# Patient Record
Sex: Male | Born: 1960 | Race: White | Hispanic: No | Marital: Married | Smoking: Current every day smoker
Health system: Southern US, Community
[De-identification: ages and names within clinical notes are randomized; demographics above are authoritative.]

## PROBLEM LIST (undated history)

## (undated) DIAGNOSIS — I10 Essential (primary) hypertension: Secondary | ICD-10-CM

## (undated) DIAGNOSIS — N200 Calculus of kidney: Secondary | ICD-10-CM

## (undated) DIAGNOSIS — C801 Malignant (primary) neoplasm, unspecified: Secondary | ICD-10-CM

## (undated) DIAGNOSIS — R972 Elevated prostate specific antigen [PSA]: Secondary | ICD-10-CM

## (undated) HISTORY — PX: PROSTATECTOMY: SHX69

## (undated) HISTORY — PX: OTHER SURGICAL HISTORY: SHX169

## (undated) HISTORY — PX: NASAL SEPTUM SURGERY: SHX37

## (undated) HISTORY — DX: Calculus of kidney: N20.0

## (undated) HISTORY — PX: TONSILECTOMY, ADENOIDECTOMY, BILATERAL MYRINGOTOMY AND TUBES: SHX2538

## (undated) HISTORY — DX: Elevated prostate specific antigen (PSA): R97.20

## (undated) HISTORY — DX: Essential (primary) hypertension: I10

## (undated) HISTORY — PX: ANKLE SURGERY: SHX546

---

## 2007-02-19 ENCOUNTER — Ambulatory Visit: Payer: Self-pay | Admitting: Emergency Medicine

## 2013-07-21 ENCOUNTER — Ambulatory Visit: Payer: Self-pay | Admitting: Emergency Medicine

## 2014-07-19 DIAGNOSIS — E7800 Pure hypercholesterolemia, unspecified: Secondary | ICD-10-CM | POA: Insufficient documentation

## 2014-08-12 DIAGNOSIS — F17200 Nicotine dependence, unspecified, uncomplicated: Secondary | ICD-10-CM | POA: Insufficient documentation

## 2014-09-13 ENCOUNTER — Ambulatory Visit (INDEPENDENT_AMBULATORY_CARE_PROVIDER_SITE_OTHER): Payer: BLUE CROSS/BLUE SHIELD | Admitting: Urology

## 2014-09-13 VITALS — BP 142/91 | HR 89 | Ht 70.0 in | Wt 257.8 lb

## 2014-09-13 DIAGNOSIS — R972 Elevated prostate specific antigen [PSA]: Secondary | ICD-10-CM

## 2014-09-13 DIAGNOSIS — R310 Gross hematuria: Secondary | ICD-10-CM

## 2014-09-13 LAB — MICROSCOPIC EXAMINATION
Bacteria, UA: NONE SEEN
Epithelial Cells (non renal): NONE SEEN /hpf (ref 0–10)
RENAL EPITHEL UA: NONE SEEN /HPF

## 2014-09-13 LAB — URINALYSIS, COMPLETE
BILIRUBIN UA: NEGATIVE
Glucose, UA: NEGATIVE
Ketones, UA: NEGATIVE
Leukocytes, UA: NEGATIVE
Nitrite, UA: NEGATIVE
Protein, UA: NEGATIVE
Specific Gravity, UA: >1.03 — ABNORMAL HIGH (ref 1.005–1.030)
UUROB: 0.2 mg/dL (ref 0.2–1.0)
pH, UA: 6 (ref 5.0–7.5)

## 2014-09-13 LAB — BLADDER SCAN AMB NON-IMAGING: Scan Result: 48

## 2014-09-13 NOTE — Progress Notes (Addendum)
H&P  Chief Complaint: Kidney stones, gross hematuria, elevated PSA  History of Present Illness:  54 year old white male seen today for gross hematuria and elevated PSA.  1-gross hematuria-last month patient developed left flank pain and said he passed flexible material, gross hematuria with a small clot and then possibly a small stone. He did not collected. 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 He did have a KUB performed but results are not available. He's had no recurrent flank pain or gross hematuria. Typically his voids with a good stream and has no frequency or urgency.  PVR 48 mL  UA with 3-6 rbc / hpf today  2-Elevated PSA-patient PSA was 4.22 check June 2016. He is not sure if his had any prior PSA screening. He has no history of BPH or prostate biopsy. His father had prostate cancer and pancreatic cancer.   Past Medical History  Diagnosis Date  . Kidney stone    Past Surgical History  Procedure Laterality Date  . Spincter surgery    . Nasal septum surgery    . Tonsilectomy, adenoidectomy, bilateral myringotomy and tubes    . Ankle surgery      bone fragments removal    Home Medications:   (Not in a hospital admission) Allergies:  Allergies  Allergen Reactions  . Tylox [Oxycodone-Acetaminophen]     Cold chills and profuse sweating    Family History  Problem Relation Age of Onset  . Prostate cancer Father   . Cancer Father     pancreatic  . Urolithiasis Father   . Cancer Paternal Grandmother   . Cancer Maternal Grandmother   . Kidney cancer Neg Hx   . Urolithiasis Brother    Social History:  reports that he has been smoking.  He does not have any smokeless tobacco history on file. He reports that he drinks alcohol. He reports that he does not use illicit drugs.  ROS: A complete review of systems was performed.  All systems are negative except for pertinent findings as noted. ROS   +constipation +sinus problems   Physical Exam:  Vital signs in last 24 hours: @VSRANGES @ General:  Alert and oriented, No acute distress HEENT: Normocephalic, atraumatic Neck: No JVD or lymphadenopathy Cardiovascular: Regular rate and rhythm Lungs: Regular rate and effort Abdomen: Soft, nontender, nondistended, no abdominal masses Back: No CVA tenderness Extremities: No edema Neurologic: Grossly intact GU: Penis circumcised and without mass or lesion. No inguinal hernias palpated. Testicles descended bilaterally and palpably normal. On digital rectal exam the prostate was small, smooth and without hard area or nodule.  Laboratory Data:  No results found for this or any previous visit (from the past 24 hour(s)). No results found for this or any previous visit (from the past 240 hour(s)). Creatinine: No results for input(s): CREATININE in the last 168 hours.  Impression/Assessment:  -Gross hematuria-unclear etiology. Possible passed stone. Pt continues to have microhematuria. Recommend at least a noncontrast CT to evaluate kidneys ureter and bladder, stage for any other stones. Depending on CT results we also discussed the nature risk benefits of cystoscopy. I recommended cystoscopy giving a smoking history. I also discussed the importance of quitting smoking and risk of renal and bladder ca.   -Elevated PSA-we discussed the nature risks and benefits of PSA screening as well as the nature of elevated PSA-benign versus malignant conditions. We will resend a PSA with a reflex to free and  if it remains elevated were discussed prostate biopsy at next visit. Discussed importance of follow-up.  Plan:  -Noncontrast CT the abdomen and pelvis -Repeat PSA -Follow-up for cystoscopy and consider prostate biopsy  Patrick Hinton 09/13/2014, 11:23 AM

## 2014-09-18 ENCOUNTER — Telehealth: Payer: Self-pay

## 2014-09-18 NOTE — Telephone Encounter (Signed)
LMOM

## 2014-09-18 NOTE — Telephone Encounter (Signed)
-----   Message from Festus Aloe, MD sent at 09/18/2014  8:17 AM EDT ----- Patient needs a PSA drawn prior to his follow-up/cystoscopy

## 2014-09-24 ENCOUNTER — Ambulatory Visit
Admission: RE | Admit: 2014-09-24 | Discharge: 2014-09-24 | Disposition: A | Discharge status: Home / Self Care | Payer: BLUE CROSS/BLUE SHIELD | Source: Ambulatory Visit | Attending: Urology | Admitting: Urology

## 2014-09-24 DIAGNOSIS — K760 Fatty (change of) liver, not elsewhere classified: Secondary | ICD-10-CM | POA: Diagnosis not present

## 2014-09-24 DIAGNOSIS — N200 Calculus of kidney: Secondary | ICD-10-CM | POA: Insufficient documentation

## 2014-09-24 DIAGNOSIS — R310 Gross hematuria: Secondary | ICD-10-CM | POA: Diagnosis present

## 2014-10-11 ENCOUNTER — Encounter: Payer: Self-pay | Admitting: Urology

## 2014-10-11 ENCOUNTER — Ambulatory Visit (INDEPENDENT_AMBULATORY_CARE_PROVIDER_SITE_OTHER): Payer: BLUE CROSS/BLUE SHIELD | Admitting: Urology

## 2014-10-11 VITALS — BP 122/79 | HR 79 | Ht 70.0 in | Wt 260.6 lb

## 2014-10-11 DIAGNOSIS — R972 Elevated prostate specific antigen [PSA]: Secondary | ICD-10-CM

## 2014-10-11 DIAGNOSIS — R310 Gross hematuria: Secondary | ICD-10-CM | POA: Diagnosis not present

## 2014-10-11 LAB — URINALYSIS, COMPLETE
BILIRUBIN UA: NEGATIVE
GLUCOSE, UA: NEGATIVE
Ketones, UA: NEGATIVE
Leukocytes, UA: NEGATIVE
Nitrite, UA: NEGATIVE
PROTEIN UA: NEGATIVE
Specific Gravity, UA: 1.025 (ref 1.005–1.030)
UUROB: 0.2 mg/dL (ref 0.2–1.0)
pH, UA: 5.5 (ref 5.0–7.5)

## 2014-10-11 LAB — MICROSCOPIC EXAMINATION
Bacteria, UA: NONE SEEN
Epithelial Cells (non renal): NONE SEEN /hpf (ref 0–10)
RBC, UA: NONE SEEN /hpf (ref 0–?)

## 2014-10-11 MED ORDER — LIDOCAINE HCL 2 % EX GEL
1.0000 | Freq: Once | CUTANEOUS | Status: AC
Start: 2014-10-11 — End: 2014-10-11
  Administered 2014-10-11: 1 via URETHRAL

## 2014-10-11 MED ORDER — CIPROFLOXACIN HCL 500 MG PO TABS
500.0000 mg | ORAL_TABLET | Freq: Once | ORAL | Status: AC
  Administered 2014-10-11: 500 mg via ORAL

## 2014-10-11 NOTE — Progress Notes (Signed)
10/11/2014 12:26 PM   Patrick Hinton [birth year] [id number]  Referring provider: Sherrin Daisy, MD [place], [place] [address]  Chief Complaint  Patient presents with  . Procedure    cystoscopy, gross hematuria    HPI:  1 - Gross Hematuria - single episode gross blood 2016 with passage of small likely stone fragment. Prior 40PY smoker. CT stone study with punctate renal stone. Cysto unremarkable.  2 - Nephrolithiasis - punctate left intrarenal stone by CT 2016. One prior passage episode.  3 - Elevated PSA - PSA 4.2 by PCP 07/2014. He does have positive family history.  Today "Patrick Hinton" is seen for cysto and f/u above. No interval hematuria.   PMH: Past Medical History  Diagnosis Date  . Kidney stone   . Elevated PSA     Surgical History: Past Surgical History  Procedure Laterality Date  . Spincter surgery    . Nasal septum surgery    . Tonsilectomy, adenoidectomy, bilateral myringotomy and tubes    . Ankle surgery      bone fragments removal    Home Medications:    Medication List       This list is accurate as of: 10/11/14 12:26 PM.  Always use your most recent med list.               acetaminophen 325 MG tablet  Commonly known as:  TYLENOL  Take 650 mg by mouth every 6 (six) hours as needed.     ibuprofen 200 MG tablet  Commonly known as:  ADVIL,MOTRIN  Take 200 mg by mouth every 6 (six) hours as needed.     loratadine 10 MG tablet  Commonly known as:  CLARITIN  Take by mouth.        Allergies:  Allergies  Allergen Reactions  . Tylox [Oxycodone-Acetaminophen]     Cold chills and profuse sweating    Family History: Family History  Problem Relation Age of Onset  . Prostate cancer Father   . Cancer Father     pancreatic  . Urolithiasis Father   . Cancer Paternal Grandmother   . Cancer Maternal Grandmother   . Kidney cancer Neg Hx   . Urolithiasis Brother     Social History:  reports that he has been smoking Cigarettes.   He has been smoking about 1.00 pack per day. He does not have any smokeless tobacco history on file. He reports that he drinks alcohol. He reports that he does not use illicit drugs.  ROS:                                        Physical Exam: BP 122/79 mmHg  Pulse 79  Ht 5\' 10"  (1.778 m)  Wt 260 lb 9.6 oz (118.207 kg)  BMI 37.39 kg/m2  Constitutional:  Alert and oriented, No acute distress. HEENT: [place] AT, moist mucus membranes.  Trachea midline, no masses. Cardiovascular: No clubbing, cyanosis, or edema. Respiratory: Normal respiratory effort, no increased work of breathing. GI: Abdomen is soft, nontender, nondistended, no abdominal masses GU: No CVA tenderness.  Skin: No rashes, bruises or suspicious lesions. Lymph: No cervical or inguinal adenopathy. Neurologic: Grossly intact, no focal deficits, moving all 4 extremities. Psychiatric: Normal mood and affect.  Laboratory Data: No results found for: WBC, HGB, HCT, MCV, PLT  No results found for: CREATININE  No results found for: PSA  No results found for: TESTOSTERONE  No results found for: HGBA1C  Urinalysis    Component Value Date/Time   GLUCOSEU Negative 09/13/2014 1038   BILIRUBINUR Negative 09/13/2014 1038   NITRITE Negative 09/13/2014 1038   LEUKOCYTESUR Negative 09/13/2014 1038    Pertinent Imaging:  CYSTOSOPY: 64F flexible scope used after betadine prep and lidocaine jelly per penis to inspect entire ureterha. No stricture / lesions. No significant prostatic hypertrophy. Bladder with very mild trabeculation, no mases / erythema / foreign bodies. UO's anatomic. No additional findings with retroflexion. Scope withdrawn w/o complication.  Assessment & Plan:    1 - Gross Hematuria - likely from recent stone passage. He did NOT have ureteral conrast studies and we discussed utility of repeating Ct with contrast v. Operative retrogrades and he declines. WE both agree that this would be  necessary if this was to recur.   2 - Nephrolithiasis - current stone volume minimal, no specific therapy warranted.   3 - Elevated PSA - repeat PSA today. (was drawn prior to cysto)  4 - RTC 3-4 weeks to review PSA, then yearly if normalized v. Discuss biopsy if truly still elevated   No Follow-up on file.  Alexis Frock, [facility] [address], [place], [place] [address] [phone]

## 2014-10-12 LAB — PSA, TOTAL AND FREE
PSA, Free Pct: 11.7 %
PSA, Free: 0.41 ng/mL
Prostate Specific Ag, Serum: 3.5 ng/mL (ref 0.0–4.0)

## 2014-11-12 ENCOUNTER — Encounter: Payer: Self-pay | Admitting: Urology

## 2014-11-12 ENCOUNTER — Ambulatory Visit (INDEPENDENT_AMBULATORY_CARE_PROVIDER_SITE_OTHER): Payer: BLUE CROSS/BLUE SHIELD | Admitting: Urology

## 2014-11-12 VITALS — BP 156/91 | HR 92 | Ht 70.0 in | Wt 259.9 lb

## 2014-11-12 DIAGNOSIS — R310 Gross hematuria: Secondary | ICD-10-CM

## 2014-11-12 DIAGNOSIS — N200 Calculus of kidney: Secondary | ICD-10-CM | POA: Diagnosis not present

## 2014-11-12 DIAGNOSIS — R972 Elevated prostate specific antigen [PSA]: Secondary | ICD-10-CM | POA: Diagnosis not present

## 2014-11-12 LAB — URINALYSIS, COMPLETE
Bilirubin, UA: NEGATIVE
GLUCOSE, UA: NEGATIVE
Ketones, UA: NEGATIVE
Leukocytes, UA: NEGATIVE
Nitrite, UA: NEGATIVE
PH UA: 7 (ref 5.0–7.5)
PROTEIN UA: NEGATIVE
Specific Gravity, UA: 1.02 (ref 1.005–1.030)
Urobilinogen, Ur: 0.2 mg/dL (ref 0.2–1.0)

## 2014-11-12 LAB — MICROSCOPIC EXAMINATION: BACTERIA UA: NONE SEEN

## 2014-11-12 NOTE — Progress Notes (Signed)
11:28 AM   Patrick Hinton 04/11/1960 [id number]  Referring provider: Sherrin Daisy, MD [place], [place] [address]  Chief Complaint  Patient presents with  . Elevated PSA    discuss PSA results    HPI:  1 - Gross Hematuria - single episode gross blood 2016 with passage of small likely stone fragment. Prior 40PY smoker. CT stone study with punctate renal stone. Cysto unremarkable. He refused repeat contrast CT.   2 - Nephrolithiasis - punctate left intrarenal stone by CT 2016. One prior passage episode.  3 - Elevated PSA - He does have positive family history. 07/2014. - PSA 4.2 by PCP labs / DRE 40gm smooth ===> Recheck 10/2014 3.5  Today "Patrick Hinton" is seen for f/u above after recheck PSA. No interval hematuria.   PMH: Past Medical History  Diagnosis Date  . Kidney stone   . Elevated PSA     Surgical History: Past Surgical History  Procedure Laterality Date  . Spincter surgery    . Nasal septum surgery    . Tonsilectomy, adenoidectomy, bilateral myringotomy and tubes    . Ankle surgery      bone fragments removal    Home Medications:    Medication List       This list is accurate as of: 11/12/14 11:28 AM.  Always use your most recent med list.               acetaminophen 325 MG tablet  Commonly known as:  TYLENOL  Take 650 mg by mouth every 6 (six) hours as needed.     ibuprofen 200 MG tablet  Commonly known as:  ADVIL,MOTRIN  Take 200 mg by mouth every 6 (six) hours as needed.     loratadine 10 MG tablet  Commonly known as:  CLARITIN  Take by mouth.        Allergies:  Allergies  Allergen Reactions  . Tylox [Oxycodone-Acetaminophen]     Cold chills and profuse sweating    Family History: Family History  Problem Relation Age of Onset  . Prostate cancer Father   . Cancer Father     pancreatic  . Urolithiasis Father   . Cancer Paternal Grandmother   . Cancer Maternal Grandmother   . Kidney cancer Neg Hx   . Urolithiasis  Brother     Social History:  reports that he has been smoking Cigarettes.  He has been smoking about 1.00 pack per day. He does not have any smokeless tobacco history on file. He reports that he drinks alcohol. He reports that he does not use illicit drugs.  ROS: UROLOGY Frequent Urination?: No Hard to postpone urination?: No Burning/pain with urination?: No Get up at night to urinate?: No Leakage of urine?: No Urine stream starts and stops?: No Trouble starting stream?: No Do you have to strain to urinate?: No Blood in urine?: No Urinary tract infection?: No Sexually transmitted disease?: No Injury to kidneys or bladder?: No Painful intercourse?: No Weak stream?: No Erection problems?: No Penile pain?: No  Gastrointestinal Nausea?: No Vomiting?: No Indigestion/heartburn?: No Diarrhea?: No Constipation?: No  Constitutional Fever: No Night sweats?: No Weight loss?: No Fatigue?: No  Skin Skin rash/lesions?: No Itching?: No  Eyes Blurred vision?: No Double vision?: No  Ears/Nose/Throat Sore throat?: No Sinus problems?: Yes  Hematologic/Lymphatic Swollen glands?: No Easy bruising?: No  Cardiovascular Leg swelling?: No Chest pain?: No  Respiratory Cough?: Yes Shortness of breath?: No  Endocrine Excessive thirst?: No  Musculoskeletal Back pain?: No Joint pain?: No  Neurological Headaches?: Yes Dizziness?: No  Psychologic Depression?: No Anxiety?: No  Physical Exam: BP 156/91 mmHg  Pulse 92  Ht 5\' 10"  (1.778 m)  Wt 259 lb 14.4 oz (117.89 kg)  BMI 37.29 kg/m2  Constitutional:  Alert and oriented, No acute distress. HEENT: [place] AT, moist mucus membranes.  Trachea midline, no masses. Cardiovascular: No clubbing, cyanosis, or edema. Respiratory: Normal respiratory effort, no increased work of breathing. GI: Abdomen is soft, nontender, nondistended, no abdominal masses GU: No CVA tenderness. DRE 40gm smooth.  Skin: No rashes, bruises or  suspicious lesions. Lymph: No cervical or inguinal adenopathy. Neurologic: Grossly intact, no focal deficits, moving all 4 extremities. Psychiatric: Normal mood and affect.  Laboratory Data: No results found for: WBC, HGB, HCT, MCV, PLT  No results found for: CREATININE  Lab Results  Component Value Date   PSA 3.5 10/11/2014    No results found for: TESTOSTERONE  No results found for: HGBA1C  Urinalysis    Component Value Date/Time   GLUCOSEU Negative 10/11/2014 1136   BILIRUBINUR Negative 10/11/2014 1136   NITRITE Negative 10/11/2014 1136   LEUKOCYTESUR Negative 10/11/2014 1136     Assessment & Plan:    1 - Gross Hematuria - likely from recent stone passage. He did NOT have ureteral conrast studies and we discussed utility of repeating Ct with contrast v. Operative retrogrades and he declines. WE both agree that this would be necessary if this was to recur.   2 - Nephrolithiasis - current stone volume minimal, no specific therapy warranted.   3 - Elevated PSA - resolved. Reinforced need for continued annual screening and strongly consider further eval / BX for persistnat upward trends / worriseom DRE.   4 - RTC 1 yeat with PSA prior.    Return in about 1 year (around 11/12/2015).  Alexis Frock, [facility] [address] [place], [place] [address] [phone]

## 2015-02-19 DIAGNOSIS — R030 Elevated blood-pressure reading, without diagnosis of hypertension: Secondary | ICD-10-CM | POA: Insufficient documentation

## 2015-11-12 ENCOUNTER — Encounter: Payer: Self-pay | Admitting: Urology

## 2015-11-12 ENCOUNTER — Ambulatory Visit (INDEPENDENT_AMBULATORY_CARE_PROVIDER_SITE_OTHER): Payer: BLUE CROSS/BLUE SHIELD | Admitting: Urology

## 2015-11-12 VITALS — BP 146/85 | HR 77 | Ht 70.0 in | Wt 256.1 lb

## 2015-11-12 DIAGNOSIS — R972 Elevated prostate specific antigen [PSA]: Secondary | ICD-10-CM

## 2015-11-12 DIAGNOSIS — R3129 Other microscopic hematuria: Secondary | ICD-10-CM | POA: Diagnosis not present

## 2015-11-12 LAB — URINALYSIS, COMPLETE
BILIRUBIN UA: NEGATIVE
Glucose, UA: NEGATIVE
KETONES UA: NEGATIVE
Leukocytes, UA: NEGATIVE
NITRITE UA: NEGATIVE
PH UA: 7 (ref 5.0–7.5)
Protein, UA: NEGATIVE
RBC UA: NEGATIVE
SPEC GRAV UA: 1.02 (ref 1.005–1.030)
UUROB: 0.2 mg/dL (ref 0.2–1.0)

## 2015-11-12 LAB — MICROSCOPIC EXAMINATION: BACTERIA UA: NONE SEEN

## 2015-11-12 NOTE — Progress Notes (Signed)
11/12/2015 11:46 AM   Patrick Hinton 1960-04-21 [id number]  Referring provider: Sherrin Daisy, MD [place], [place] [id number]  Chief Complaint  Patient presents with  . Follow-up    gross hematuria, elevated PSA    HPI:  1 - Gross Hematuria - single episode gross blood 2016 with passage of small likely stone fragment. Prior 40PY smoker. CT stone study with punctate renal stone. Cysto unremarkable. He refused repeat contrast CT.   2 - Nephrolithiasis - punctate left intrarenal stone by CT 2016. One prior passage episode.  3 - Elevated PSA - He does have positive family history. 07/2014. - PSA 4.2 by PCP labs / DRE 40gm smooth ===> Recheck 10/2014 3.5  Today, patient seen for the above. He has had no gross hematuria. PSA was drawn. He thinks he maybe passed a stone three months ago with some flank pain and penile pain. This quickly resolved. He has 3-10 rbc's on UA today.    PMH: Past Medical History:  Diagnosis Date  . Elevated PSA   . Kidney stone     Surgical History: Past Surgical History:  Procedure Laterality Date  . ANKLE SURGERY     bone fragments removal  . NASAL SEPTUM SURGERY    . spincter surgery    . TONSILECTOMY, ADENOIDECTOMY, BILATERAL MYRINGOTOMY AND TUBES      Home Medications:    Medication List       Accurate as of 11/12/15 11:46 AM. Always use your most recent med list.          acetaminophen 325 MG tablet Commonly known as:  TYLENOL Take 650 mg by mouth every 6 (six) hours as needed.   cyclobenzaprine 10 MG tablet Commonly known as:  FLEXERIL Take by mouth.   ibuprofen 200 MG tablet Commonly known as:  ADVIL,MOTRIN Take 200 mg by mouth every 6 (six) hours as needed.   loratadine 10 MG tablet Commonly known as:  CLARITIN Take by mouth.       Allergies:  Allergies  Allergen Reactions  . Tylox [Oxycodone-Acetaminophen]     Cold chills and profuse sweating    Family History: Family History  Problem  Relation Age of Onset  . Prostate cancer Father   . Cancer Father     pancreatic  . Urolithiasis Father   . Cancer Paternal Grandmother   . Cancer Maternal Grandmother   . Kidney cancer Neg Hx   . Urolithiasis Brother     Social History:  reports that he has been smoking Cigarettes.  He has been smoking about 1.00 pack per day. He does not have any smokeless tobacco history on file. He reports that he drinks alcohol. He reports that he does not use drugs.  ROS: UROLOGY Frequent Urination?: No Hard to postpone urination?: No Burning/pain with urination?: No Get up at night to urinate?: No Leakage of urine?: No Urine stream starts and stops?: No Trouble starting stream?: No Do you have to strain to urinate?: No Blood in urine?: No Urinary tract infection?: No Sexually transmitted disease?: No Injury to kidneys or bladder?: No Painful intercourse?: No Weak stream?: No Erection problems?: No Penile pain?: No  Gastrointestinal Nausea?: No Vomiting?: No Indigestion/heartburn?: No Diarrhea?: No Constipation?: No  Constitutional Fever: No Night sweats?: No Weight loss?: No Fatigue?: No  Skin Skin rash/lesions?: No Itching?: No  Eyes Blurred vision?: No Double vision?: No  Ears/Nose/Throat Sore throat?: No Sinus problems?: Yes  Hematologic/Lymphatic Swollen glands?: No Easy bruising?: No  Cardiovascular Leg swelling?: No Chest pain?: No  Respiratory Cough?: No Shortness of breath?: No  Endocrine Excessive thirst?: No  Musculoskeletal Back pain?: No Joint pain?: No  Neurological Headaches?: No Dizziness?: No  Psychologic Depression?: No Anxiety?: No  Physical Exam: BP (!) 146/85   Pulse 77   Ht 5\' 10"  (1.778 m)   Wt 116.2 kg (256 lb 1.6 oz)   BMI 36.75 kg/m   Constitutional:  Alert and oriented, No acute distress. HEENT: [place] AT, moist mucus membranes.  Trachea midline, no masses. Cardiovascular: No clubbing, cyanosis, or  edema. Respiratory: Normal respiratory effort, no increased work of breathing. Skin: No rashes, bruises or suspicious lesions. Lymph: No cervical or inguinal adenopathy. Neurologic: Grossly intact, no focal deficits, moving all 4 extremities. Psychiatric: Normal mood and affect.  Laboratory Data: No results found for: WBC, HGB, HCT, MCV, PLT  No results found for: CREATININE  No results found for: PSA  No results found for: TESTOSTERONE  No results found for: HGBA1C  Urinalysis    Component Value Date/Time   APPEARANCEUR Clear 11/12/2014 1113   GLUCOSEU Negative 11/12/2014 1113   BILIRUBINUR Negative 11/12/2014 1113   PROTEINUR Negative 11/12/2014 1113   NITRITE Negative 11/12/2014 1113   LEUKOCYTESUR Negative 11/12/2014 1113    Imaging: CT images reviewed   Assessment & Plan:    1. Elevated PSA PSA was sent. Nl DRE today. Small prostate.   2. Gross hematuria resolved - Urinalysis, Complete  3. MH - check kidneys with renal u/s. F/u 1 year if stable.    No Follow-up on file.  Festus Aloe, [facility] [address] [place], [place] [address] [phone]

## 2015-11-13 LAB — PSA: Prostate Specific Ag, Serum: 3.4 ng/mL (ref 0.0–4.0)

## 2016-01-13 ENCOUNTER — Ambulatory Visit
Admission: RE | Admit: 2016-01-13 | Discharge: 2016-01-13 | Disposition: A | Discharge status: Home / Self Care | Payer: BLUE CROSS/BLUE SHIELD | Source: Ambulatory Visit | Attending: Urology | Admitting: Urology

## 2016-01-13 DIAGNOSIS — R3129 Other microscopic hematuria: Secondary | ICD-10-CM | POA: Diagnosis present

## 2016-01-21 ENCOUNTER — Telehealth: Payer: Self-pay

## 2016-01-21 NOTE — Telephone Encounter (Signed)
Festus Aloe, MD  Lestine Box, LPN        Notify patient his renal u/s was normal -- f/u next year as planned    No answer.

## 2016-01-22 NOTE — Telephone Encounter (Signed)
No vm set up. 

## 2016-01-23 NOTE — Telephone Encounter (Signed)
Spoke with pt in reference to RUS results. Pt voiced understanding.  

## 2016-08-02 ENCOUNTER — Ambulatory Visit: Admission: RE | Admit: 2016-08-02 | Source: Ambulatory Visit | Admitting: Gastroenterology

## 2016-08-02 ENCOUNTER — Encounter: Admission: RE | Payer: Self-pay | Source: Ambulatory Visit

## 2016-08-02 SURGERY — COLONOSCOPY WITH PROPOFOL
Anesthesia: General

## 2016-11-09 ENCOUNTER — Ambulatory Visit: Payer: BLUE CROSS/BLUE SHIELD

## 2017-03-10 ENCOUNTER — Encounter: Payer: Self-pay | Admitting: Urology

## 2017-03-10 ENCOUNTER — Ambulatory Visit (INDEPENDENT_AMBULATORY_CARE_PROVIDER_SITE_OTHER): Admitting: Urology

## 2017-03-10 VITALS — BP 137/89 | HR 96 | Ht 70.0 in | Wt 256.6 lb

## 2017-03-10 DIAGNOSIS — R310 Gross hematuria: Secondary | ICD-10-CM | POA: Diagnosis not present

## 2017-03-10 DIAGNOSIS — N200 Calculus of kidney: Secondary | ICD-10-CM

## 2017-03-10 DIAGNOSIS — Z125 Encounter for screening for malignant neoplasm of prostate: Secondary | ICD-10-CM

## 2017-03-10 NOTE — Progress Notes (Signed)
03/10/2017 11:04 AM   Patrick Hinton August 25, 1960 [id number]  Referring provider: Sherrin Daisy, MD No address on file  Chief Complaint  Patient presents with  . Follow-up    HPI: The patient is a 57 year old gentleman who presents today for annual follow-up.  1 - History of Gross Hematuria- single episode gross blood 2016 with passage of small likely stone fragment. Prior 40PY smoker. CT stone study with punctate renal stone. Cysto unremarkable. He refused repeat contrast CT. No further episodes.  2 - Nephrolithiasis- punctate left intrarenal stone by CT 2016. One prior passage episode.  3 - History of Elevated PSA- He does have positive family history. 07/2014. - PSA 4.2 by PCP labs / DRE 40gm smooth ===> Recheck 10/2014 3.5. 3.4 in September 2017.  He reports no issues over the last year.  He voids well with a good stream.  He is not bothered by any urinary symptoms.  No further episodes of gross hematuria which occurred a few years ago.  Due for PSA today.  PMH: Past Medical History:  Diagnosis Date  . Elevated PSA   . Kidney stone     Surgical History: Past Surgical History:  Procedure Laterality Date  . ANKLE SURGERY     bone fragments removal  . NASAL SEPTUM SURGERY    . spincter surgery    . TONSILECTOMY, ADENOIDECTOMY, BILATERAL MYRINGOTOMY AND TUBES      Home Medications:  Allergies as of 03/10/2017      Reactions   Tylox [oxycodone-acetaminophen]    Cold chills and profuse sweating      Medication List        Accurate as of 03/10/17 11:04 AM. Always use your most recent med list.          acetaminophen 325 MG tablet Commonly known as:  TYLENOL Take 650 mg by mouth every 6 (six) hours as needed.   ibuprofen 200 MG tablet Commonly known as:  ADVIL,MOTRIN Take 200 mg by mouth every 6 (six) hours as needed.   loratadine 10 MG tablet Commonly known as:  CLARITIN Take by mouth.       Allergies:  Allergies  Allergen Reactions  .  Tylox [Oxycodone-Acetaminophen]     Cold chills and profuse sweating    Family History: Family History  Problem Relation Age of Onset  . Prostate cancer Father   . Cancer Father        pancreatic  . Urolithiasis Father   . Cancer Paternal Grandmother   . Cancer Maternal Grandmother   . Kidney cancer Neg Hx   . Urolithiasis Brother     Social History:  reports that he has been smoking cigarettes.  He has been smoking about 1.00 pack per day. he has never used smokeless tobacco. He reports that he drinks alcohol. He reports that he does not use drugs.  ROS: UROLOGY Frequent Urination?: No Hard to postpone urination?: No Burning/pain with urination?: No Get up at night to urinate?: No Leakage of urine?: No Urine stream starts and stops?: No Trouble starting stream?: No Do you have to strain to urinate?: No Blood in urine?: No Urinary tract infection?: No Sexually transmitted disease?: No Injury to kidneys or bladder?: No Painful intercourse?: No Weak stream?: No Erection problems?: No Penile pain?: No  Gastrointestinal Nausea?: No Vomiting?: No Indigestion/heartburn?: No Diarrhea?: No Constipation?: No  Constitutional Fever: No Night sweats?: No Weight loss?: No Fatigue?: No  Skin Skin rash/lesions?: No Itching?: No  Eyes Blurred vision?: No  Double vision?: No  Ears/Nose/Throat Sore throat?: No Sinus problems?: No  Hematologic/Lymphatic Swollen glands?: No Easy bruising?: No  Cardiovascular Leg swelling?: No Chest pain?: No  Respiratory Cough?: No Shortness of breath?: No  Endocrine Excessive thirst?: No  Musculoskeletal Back pain?: No Joint pain?: Yes  Neurological Headaches?: No Dizziness?: No  Psychologic Depression?: No Anxiety?: No  Physical Exam: BP 137/89 (BP Location: Left Arm, Patient Position: Sitting, Cuff Size: Large)   Pulse 96   Ht 5\' 10"  (1.778 m)   Wt 256 lb 9.6 oz (116.4 kg)   BMI 36.82 kg/m     Constitutional:  Alert and oriented, No acute distress. HEENT: [place] AT, moist mucus membranes.  Trachea midline, no masses. Cardiovascular: No clubbing, cyanosis, or edema. Respiratory: Normal respiratory effort, no increased work of breathing. GI: Abdomen is soft, nontender, nondistended, no abdominal masses GU: No CVA tenderness.  DRE 2+ smooth benign. Skin: No rashes, bruises or suspicious lesions. Lymph: No cervical or inguinal adenopathy. Neurologic: Grossly intact, no focal deficits, moving all 4 extremities. Psychiatric: Normal mood and affect.  Laboratory Data: No results found for: WBC, HGB, HCT, MCV, PLT  No results found for: CREATININE  No results found for: PSA  No results found for: TESTOSTERONE  No results found for: HGBA1C  Urinalysis    Component Value Date/Time   APPEARANCEUR Cloudy (A) 11/12/2015 1114   GLUCOSEU Negative 11/12/2015 1114   BILIRUBINUR Negative 11/12/2015 1114   PROTEINUR Negative 11/12/2015 1114   NITRITE Negative 11/12/2015 1114   LEUKOCYTESUR Negative 11/12/2015 1114    Assessment & Plan:    1.  History of elevated PSA We will check PSA today.  He will follow-up in 1 year to continue prostate cancer screening.  Return in about 1 year (around 03/10/2018).  Nickie Retort, MD  [facility] [address] [place],  [address] [phone]

## 2017-03-11 LAB — PSA: Prostate Specific Ag, Serum: 6.3 ng/mL — ABNORMAL HIGH (ref 0.0–4.0)

## 2017-04-28 ENCOUNTER — Other Ambulatory Visit

## 2017-04-28 DIAGNOSIS — R972 Elevated prostate specific antigen [PSA]: Secondary | ICD-10-CM

## 2017-04-29 LAB — PSA: Prostate Specific Ag, Serum: 4.3 ng/mL — ABNORMAL HIGH (ref 0.0–4.0)

## 2017-07-04 DIAGNOSIS — Z87891 Personal history of nicotine dependence: Secondary | ICD-10-CM | POA: Insufficient documentation

## 2018-03-09 ENCOUNTER — Encounter: Payer: Self-pay | Admitting: Urology

## 2018-03-09 ENCOUNTER — Ambulatory Visit (INDEPENDENT_AMBULATORY_CARE_PROVIDER_SITE_OTHER): Admitting: Urology

## 2018-03-09 VITALS — BP 155/100 | HR 86 | Ht 70.0 in | Wt 271.0 lb

## 2018-03-09 DIAGNOSIS — R972 Elevated prostate specific antigen [PSA]: Secondary | ICD-10-CM

## 2018-03-09 NOTE — Progress Notes (Signed)
03/09/2018 3:20 PM   Antonietta Breach [birth year] [id number]  Referring provider: No referring provider defined for this encounter.  Chief Complaint  Patient presents with  . Elevated PSA   Urologic history: 1 - History of Gross Hematuria- single episode gross blood 2016 with passage of small likely stone fragment. CT stone study with punctate renal stone. Cysto unremarkable.  Refused follow-up contrast study  2 - Nephrolithiasis- punctate left intrarenal stone by CT 2016. One prior passage episode.  3 - History of Elevated PSA-Baseline PSA low to mid 3 range.   HPI: 57 year old male presents for annual follow-up.  He presently has no bothersome lower urinary tract symptoms.  Denies recurrent gross hematuria.  He saw Dr. Pilar Jarvis in January 2019 at last visit and his PSA was 6.3.  This was repeated in March 2019 and was 4.3.  He denies bothersome lower urinary tract symptoms or flank/abdominal/pelvic/scrotal pain.   PMH: Past Medical History:  Diagnosis Date  . Elevated PSA   . Kidney stone     Surgical History: Past Surgical History:  Procedure Laterality Date  . ANKLE SURGERY     bone fragments removal  . NASAL SEPTUM SURGERY    . spincter surgery    . TONSILECTOMY, ADENOIDECTOMY, BILATERAL MYRINGOTOMY AND TUBES      Home Medications:  Allergies as of 03/09/2018      Reactions   Tylox [oxycodone-acetaminophen]    Cold chills and profuse sweating      Medication List       Accurate as of March 09, 2018  3:20 PM. Always use your most recent med list.        acetaminophen 325 MG tablet Commonly known as:  TYLENOL Take 650 mg by mouth every 6 (six) hours as needed.   fexofenadine 180 MG tablet Commonly known as:  ALLEGRA Take by mouth.   ibuprofen 200 MG tablet Commonly known as:  ADVIL,MOTRIN Take 200 mg by mouth every 6 (six) hours as needed.   loratadine 10 MG tablet Commonly known as:  CLARITIN Take by mouth.       Allergies:    Allergies  Allergen Reactions  . Tylox [Oxycodone-Acetaminophen]     Cold chills and profuse sweating    Family History: Family History  Problem Relation Age of Onset  . Prostate cancer Father   . Cancer Father        pancreatic  . Urolithiasis Father   . Cancer Paternal Grandmother   . Cancer Maternal Grandmother   . Kidney cancer Neg Hx   . Urolithiasis Brother     Social History:  reports that he has been smoking cigarettes. He has been smoking about 1.00 pack per day. He has never used smokeless tobacco. He reports current alcohol use. He reports that he does not use drugs.  ROS: UROLOGY Frequent Urination?: No Hard to postpone urination?: No Burning/pain with urination?: No Get up at night to urinate?: Yes Leakage of urine?: No Urine stream starts and stops?: No Trouble starting stream?: No Do you have to strain to urinate?: No Blood in urine?: No Urinary tract infection?: No Sexually transmitted disease?: No Injury to kidneys or bladder?: No Painful intercourse?: No Weak stream?: No Erection problems?: No Penile pain?: No  Gastrointestinal Nausea?: No Vomiting?: No Indigestion/heartburn?: No Diarrhea?: No Constipation?: No  Constitutional Fever: No Night sweats?: No Weight loss?: No Fatigue?: No  Skin Skin rash/lesions?: No Itching?: No  Eyes Blurred vision?: No Double vision?: No  Ears/Nose/Throat Sore  throat?: No Sinus problems?: No  Hematologic/Lymphatic Swollen glands?: No Easy bruising?: No  Cardiovascular Leg swelling?: No Chest pain?: No  Respiratory Cough?: No Shortness of breath?: No  Endocrine Excessive thirst?: No  Musculoskeletal Back pain?: No Joint pain?: No  Neurological Headaches?: No Dizziness?: No  Psychologic Depression?: No Anxiety?: No  Physical Exam: BP (!) 155/100   Pulse 86   Ht 5\' 10"  (1.778 m)   Wt 271 lb (122.9 kg)   BMI 38.88 kg/m   Constitutional:  Alert and oriented, No acute  distress. HEENT: [place] AT, moist mucus membranes.  Trachea midline, no masses. Cardiovascular: No clubbing, cyanosis, or edema. Respiratory: Normal respiratory effort, no increased work of breathing. GI: Abdomen is soft, nontender, nondistended, no abdominal masses GU: No CVA tenderness.  Prostate 35 g, smooth without nodules. Lymph: No cervical or inguinal lymphadenopathy. Skin: No rashes, bruises or suspicious lesions. Neurologic: Grossly intact, no focal deficits, moving all 4 extremities. Psychiatric: Normal mood and affect.   Assessment & Plan:   58 year old male with history of an elevated PSA.  DRE today is benign.  PSA was ordered and he will be notified with the results.  Although PSA is a prostate cancer screening test he was informed that cancer is not the most common cause of an elevated PSA. Other potential causes including BPH and inflammation were discussed. He was informed that the only way to adequately diagnose prostate cancer would be a transrectal ultrasound and biopsy of the prostate. The procedure was discussed including potential risks of bleeding and infection/sepsis. He was also informed that a negative biopsy does not conclusively rule out the possibility that prostate cancer may be present and that continued monitoring is required. The use of newer adjunctive blood tests including PHI and 4kScore were discussed. The use of multiparametric prostate MRI was also discussed however is not typically used for initial evaluation of an elevated PSA. Continued periodic surveillance was also discussed.  Urinalysis was also ordered.   Abbie Sons, [place] [address], [place], [place] [address] [phone]

## 2018-03-10 ENCOUNTER — Encounter: Payer: Self-pay | Admitting: Urology

## 2018-03-10 LAB — PSA: PROSTATE SPECIFIC AG, SERUM: 5.4 ng/mL — AB (ref 0.0–4.0)

## 2018-03-10 LAB — URINALYSIS, COMPLETE
BILIRUBIN UA: NEGATIVE
Glucose, UA: NEGATIVE
KETONES UA: NEGATIVE
Leukocytes, UA: NEGATIVE
NITRITE UA: NEGATIVE
Protein, UA: NEGATIVE
RBC, UA: NEGATIVE
Specific Gravity, UA: 1.015 (ref 1.005–1.030)
UUROB: 0.2 mg/dL (ref 0.2–1.0)
pH, UA: 7 (ref 5.0–7.5)

## 2018-03-13 ENCOUNTER — Telehealth: Payer: Self-pay

## 2018-03-13 NOTE — Telephone Encounter (Signed)
-----   Message from Abbie Sons, MD sent at 03/10/2018  8:05 AM EST ----- PSA remains elevated at 5.4.  I would recommend scheduling a prostate biopsy.  If he is reluctant to schedule we can see if his insurance will cover a prostate MRI.

## 2018-03-17 NOTE — Telephone Encounter (Signed)
Spoke with patient he was notified and did read mychart message and is deciding on how he would like to proceed. Will call next week

## 2018-03-24 NOTE — Telephone Encounter (Signed)
Called pt informed him of the information below, pt states that at this time he does not want to do anything in regards to his PSA until after March 1st.

## 2018-04-13 ENCOUNTER — Encounter

## 2018-10-09 ENCOUNTER — Ambulatory Visit (INDEPENDENT_AMBULATORY_CARE_PROVIDER_SITE_OTHER): Admitting: Urology

## 2018-10-09 ENCOUNTER — Encounter: Payer: Self-pay | Admitting: Urology

## 2018-10-09 ENCOUNTER — Other Ambulatory Visit: Payer: Self-pay | Admitting: Urology

## 2018-10-09 ENCOUNTER — Other Ambulatory Visit: Payer: Self-pay

## 2018-10-09 VITALS — BP 154/94 | HR 89 | Ht 70.0 in | Wt 274.0 lb

## 2018-10-09 DIAGNOSIS — R972 Elevated prostate specific antigen [PSA]: Secondary | ICD-10-CM | POA: Diagnosis not present

## 2018-10-09 MED ORDER — GENTAMICIN SULFATE 40 MG/ML IJ SOLN
80.0000 mg | Freq: Once | INTRAMUSCULAR | Status: AC
  Administered 2018-10-09: 80 mg via INTRAMUSCULAR

## 2018-10-09 MED ORDER — LEVOFLOXACIN 500 MG PO TABS
500.0000 mg | ORAL_TABLET | Freq: Once | ORAL | Status: AC
  Administered 2018-10-09: 500 mg via ORAL

## 2018-10-09 NOTE — Progress Notes (Signed)
Prostate Biopsy Procedure   Indications: Elevated PSA  Informed consent was obtained after discussing risks/benefits of the procedure.  A time out was performed to ensure correct patient identity.  Pre-Procedure: - Last PSA Level: 5.4 (02/2018) - Gentamicin given prophylactically - Levaquin 500 mg administered PO -Transrectal Ultrasound performed revealing a 19 gm prostate -No significant hypoechoic or median lobe noted  Procedure: - Prostate block performed using 10 cc 1% lidocaine and biopsies taken from sextant areas, a total of 12 under ultrasound guidance.  Post-Procedure: - Patient tolerated the procedure well - He was counseled to seek immediate medical attention if experiences any severe pain, significant bleeding, or fevers - Return in one week to discuss biopsy results  John Giovanni, MD

## 2018-10-10 LAB — PSA: Prostate Specific Ag, Serum: 6 ng/mL — ABNORMAL HIGH (ref 0.0–4.0)

## 2018-10-17 DIAGNOSIS — I10 Essential (primary) hypertension: Secondary | ICD-10-CM | POA: Insufficient documentation

## 2018-10-17 LAB — ANATOMIC PATHOLOGY REPORT: PDF Image: 0

## 2018-10-24 ENCOUNTER — Ambulatory Visit: Admitting: Urology

## 2018-10-25 ENCOUNTER — Other Ambulatory Visit: Payer: Self-pay

## 2018-10-25 ENCOUNTER — Ambulatory Visit (INDEPENDENT_AMBULATORY_CARE_PROVIDER_SITE_OTHER): Admitting: Urology

## 2018-10-25 ENCOUNTER — Encounter: Payer: Self-pay | Admitting: Urology

## 2018-10-25 VITALS — BP 115/79 | HR 91 | Ht 70.0 in | Wt 275.0 lb

## 2018-10-25 DIAGNOSIS — C61 Malignant neoplasm of prostate: Secondary | ICD-10-CM | POA: Diagnosis not present

## 2018-10-27 ENCOUNTER — Encounter: Payer: Self-pay | Admitting: Urology

## 2018-10-27 NOTE — Progress Notes (Signed)
10/25/2018 9:13 AM   Patrick Hinton April 10, 1960 [id number]  Referring provider: No referring provider defined for this encounter.  Chief Complaint  Patient presents with  . Results    HPI: Mr. Cobble presents today for prostate biopsy follow-up.  He had no post biopsy complaints.  PSA: 5.4 TRUS: 19 g prostate; no echogenic abnormalities Pathology:  -7/12 cores positive including Gleason 4+3 RLA (90%), RLB (80%).  Refer to path report for details  PMH: Past Medical History:  Diagnosis Date  . Elevated PSA   . Hypertension   . Kidney stone     Surgical History: Past Surgical History:  Procedure Laterality Date  . ANKLE SURGERY     bone fragments removal  . NASAL SEPTUM SURGERY    . spincter surgery    . TONSILECTOMY, ADENOIDECTOMY, BILATERAL MYRINGOTOMY AND TUBES      Home Medications:  Allergies as of 10/25/2018      Reactions   Tylox [oxycodone-acetaminophen]    Cold chills and profuse sweating      Medication List       Accurate as of October 25, 2018 11:59 PM. If you have any questions, ask your nurse or doctor.        acetaminophen 325 MG tablet Commonly known as: TYLENOL Take 650 mg by mouth every 6 (six) hours as needed.   fexofenadine 180 MG tablet Commonly known as: ALLEGRA Take by mouth.   hydrochlorothiazide 25 MG tablet Commonly known as: HYDRODIURIL Take 25 mg by mouth daily.   ibuprofen 200 MG tablet Commonly known as: ADVIL Take 200 mg by mouth every 6 (six) hours as needed.   lisinopril 10 MG tablet Commonly known as: ZESTRIL Take 10 mg by mouth daily.   loratadine 10 MG tablet Commonly known as: CLARITIN Take by mouth.       Allergies:  Allergies  Allergen Reactions  . Tylox [Oxycodone-Acetaminophen]     Cold chills and profuse sweating    Family History: Family History  Problem Relation Age of Onset  . Prostate cancer Father   . Cancer Father        pancreatic  . Urolithiasis Father   . Cancer Paternal  Grandmother   . Cancer Maternal Grandmother   . Urolithiasis Brother   . Kidney cancer Neg Hx     Social History:  reports that he has been smoking cigarettes. He has been smoking about 1.00 pack per day. He has never used smokeless tobacco. He reports current alcohol use. He reports that he does not use drugs.  ROS: UROLOGY Frequent Urination?: No Hard to postpone urination?: No Burning/pain with urination?: No Get up at night to urinate?: Yes Leakage of urine?: No Urine stream starts and stops?: No Trouble starting stream?: No Do you have to strain to urinate?: No Blood in urine?: No Urinary tract infection?: No Sexually transmitted disease?: No Injury to kidneys or bladder?: No Painful intercourse?: No Weak stream?: No Erection problems?: Yes Penile pain?: No  Gastrointestinal Nausea?: No Vomiting?: No Indigestion/heartburn?: No Diarrhea?: No Constipation?: No  Constitutional Fever: No Night sweats?: No Weight loss?: No Fatigue?: No  Skin Skin rash/lesions?: No Itching?: No  Eyes Blurred vision?: No Double vision?: No  Ears/Nose/Throat Sore throat?: No Sinus problems?: Yes  Hematologic/Lymphatic Swollen glands?: No Easy bruising?: No  Cardiovascular Leg swelling?: No Chest pain?: No  Respiratory Cough?: No Shortness of breath?: No  Endocrine Excessive thirst?: No  Musculoskeletal Back pain?: No Joint pain?: Yes  Neurological Headaches?: No  Dizziness?: No  Psychologic Depression?: No Anxiety?: No  Physical Exam: BP 115/79 (BP Location: Left Arm, Patient Position: Sitting, Cuff Size: Large)   Pulse 91   Ht 5\' 10"  (1.778 m)   Wt 275 lb (124.7 kg)   BMI 39.46 kg/m   Constitutional:  Alert and oriented, No acute distress.   Assessment & Plan:    - Clinical T1c intermediate risk (unfavorable) prostate cancer The pathology report was discussed in detail and he was informed this is not a diagnosis that active surveillance would be  recommended.  We discussed potential curative treatment options including RALP and radiation modalities including IMRT and brachytherapy/IMRT.  Schedule CT of the abdomen and pelvis to evaluate evidence of extracapsular disease/adenopathy.  He will be contacted with results and further discussion of treatment once the CT is reviewed.  Greater than 50% of this 15-minute visit was spent counseling the patient.   Abbie Sons, [place] [address], [place], [place] [address] [phone]

## 2018-10-30 ENCOUNTER — Encounter: Payer: Self-pay | Admitting: Urology

## 2018-10-30 DIAGNOSIS — C61 Malignant neoplasm of prostate: Secondary | ICD-10-CM | POA: Insufficient documentation

## 2018-11-09 ENCOUNTER — Ambulatory Visit
Admission: RE | Admit: 2018-11-09 | Discharge: 2018-11-09 | Disposition: A | Discharge status: Home / Self Care | Source: Ambulatory Visit | Attending: Urology | Admitting: Urology

## 2018-11-09 ENCOUNTER — Other Ambulatory Visit: Payer: Self-pay | Admitting: Pediatrics

## 2018-11-09 ENCOUNTER — Other Ambulatory Visit: Payer: Self-pay

## 2018-11-09 ENCOUNTER — Ambulatory Visit
Admission: RE | Admit: 2018-11-09 | Discharge: 2018-11-09 | Disposition: A | Discharge status: Home / Self Care | Source: Home / Self Care | Attending: Pediatrics | Admitting: Pediatrics

## 2018-11-09 ENCOUNTER — Ambulatory Visit
Admission: RE | Admit: 2018-11-09 | Discharge: 2018-11-09 | Disposition: A | Discharge status: Home / Self Care | Source: Ambulatory Visit | Attending: Pediatrics | Admitting: Pediatrics

## 2018-11-09 DIAGNOSIS — R0602 Shortness of breath: Secondary | ICD-10-CM

## 2018-11-09 DIAGNOSIS — C61 Malignant neoplasm of prostate: Secondary | ICD-10-CM | POA: Diagnosis not present

## 2018-11-09 HISTORY — DX: Malignant (primary) neoplasm, unspecified: C80.1

## 2018-11-09 MED ORDER — IOHEXOL 300 MG/ML  SOLN
125.0000 mL | Freq: Once | INTRAMUSCULAR | Status: AC | PRN
  Administered 2018-11-09: 08:00:00 125 mL via INTRAVENOUS

## 2018-11-13 ENCOUNTER — Encounter: Payer: Self-pay | Admitting: Urology

## 2018-11-14 ENCOUNTER — Telehealth: Payer: Self-pay | Admitting: Urology

## 2018-11-14 DIAGNOSIS — E278 Other specified disorders of adrenal gland: Secondary | ICD-10-CM

## 2018-11-14 DIAGNOSIS — C61 Malignant neoplasm of prostate: Secondary | ICD-10-CM

## 2018-11-14 DIAGNOSIS — N2889 Other specified disorders of kidney and ureter: Secondary | ICD-10-CM

## 2018-11-14 NOTE — Telephone Encounter (Signed)
I contacted Patrick Hinton today regarding his CT of the abdomen and pelvis.  No gross evidence of extracapsular extension of prostate cancer or pelvic/retroperitoneal adenopathy.  He was incidentally noted to have a 1.9 x 2.1 cm left indeterminate renal lesion along with a 1.5 indeterminate left adrenal lesion.  We discussed both radical prostatectomy and radiation therapy for curative treatment options for prostate cancer.  He would like to discuss radiation further with radiation oncology and a referral was placed.  If he elects surgery will schedule appointment with Dr. Erlene Quan or Dr. Diamantina Providence.  We will also schedule abdominal protocol MRI with/without contrast

## 2018-11-21 ENCOUNTER — Other Ambulatory Visit: Payer: Self-pay

## 2018-11-22 ENCOUNTER — Other Ambulatory Visit: Payer: Self-pay

## 2018-11-22 ENCOUNTER — Ambulatory Visit
Admission: RE | Admit: 2018-11-22 | Discharge: 2018-11-22 | Disposition: A | Discharge status: Home / Self Care | Source: Ambulatory Visit | Attending: Radiation Oncology | Admitting: Radiation Oncology

## 2018-11-22 ENCOUNTER — Encounter: Payer: Self-pay | Admitting: Urology

## 2018-11-22 ENCOUNTER — Encounter: Payer: Self-pay | Admitting: Radiation Oncology

## 2018-11-22 VITALS — BP 123/81 | HR 88 | Temp 97.6°F | Resp 16 | Wt 282.4 lb

## 2018-11-22 DIAGNOSIS — Z79899 Other long term (current) drug therapy: Secondary | ICD-10-CM | POA: Diagnosis not present

## 2018-11-22 DIAGNOSIS — F17210 Nicotine dependence, cigarettes, uncomplicated: Secondary | ICD-10-CM | POA: Insufficient documentation

## 2018-11-22 DIAGNOSIS — I10 Essential (primary) hypertension: Secondary | ICD-10-CM | POA: Insufficient documentation

## 2018-11-22 DIAGNOSIS — E669 Obesity, unspecified: Secondary | ICD-10-CM | POA: Insufficient documentation

## 2018-11-22 DIAGNOSIS — C61 Malignant neoplasm of prostate: Secondary | ICD-10-CM | POA: Diagnosis present

## 2018-11-22 DIAGNOSIS — Z800 Family history of malignant neoplasm of digestive organs: Secondary | ICD-10-CM | POA: Insufficient documentation

## 2018-11-22 DIAGNOSIS — Z87442 Personal history of urinary calculi: Secondary | ICD-10-CM | POA: Insufficient documentation

## 2018-11-22 NOTE — Consult Note (Signed)
NEW PATIENT EVALUATION  Name: Patrick Hinton  MRN: [id number]  Date:   11/22/2018     DOB: [birth year]   This 57 y.o. 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  4  REFERRING PHYSICIAN: Abbie Sons, MD  CHIEF COMPLAINT:  Chief Complaint  Patient presents with  . Prostate Cancer    Initial consultation    DIAGNOSIS: The encounter diagnosis was Prostate cancer ([place]).   PREVIOUS INVESTIGATIONS:  CT scan abdomen pelvis reviewed MRI scan pending Pathology report reviewed Clinical notes reviewed  HPI: Patient is a 58 year old male with some mild morbid obesity presented with a single episode of gross hematuria back in 2016.  He has had an elevated PSA up to 6.3 back in January 2019.  Dr. Bernardo Heater noticed his PSA was 5.4 patient underwent transrectal ultrasound-guided biopsy showing 7 of 12 cores positive for adenocarcinoma mostly Gleason 7 (3+4 although 2 cores were positive for (4+3).  Patient underwent a CT scan of the abdomen and pelvis.  Prostate showed no evidence of extracapsular extension or pelvic lymphadenopathy.  Incidentally there was a 1.5 cm indeterminate left adrenal nodule new compared to prior study in 2016.  There was also a 2.1 cm mass in the lower pole of the left kidney for which MRI scan has been ordered.  Patient is fairly asymptomatic has nocturia x2 no significant frequency or urgency.  He has seen Dr. Bernardo Heater and discussed robotic prostatectomy is now seen for radiation oncology opinion.  PLANNED TREATMENT REGIMEN: Robotic prostatectomy  PAST MEDICAL HISTORY:  has a past medical history of Cancer ([place]), Elevated PSA, Hypertension, and Kidney stone.    PAST SURGICAL HISTORY:  Past Surgical History:  Procedure Laterality Date  . ANKLE SURGERY     bone fragments removal  . NASAL SEPTUM SURGERY    . spincter surgery    . TONSILECTOMY, ADENOIDECTOMY,  BILATERAL MYRINGOTOMY AND TUBES      FAMILY HISTORY: family history includes Cancer in his father, maternal grandmother, and paternal grandmother; Prostate cancer in his father; Urolithiasis in his brother and father.  SOCIAL HISTORY:  reports that he has been smoking cigarettes. He has been smoking about 1.00 pack per day. He has never used smokeless tobacco. He reports current alcohol use. He reports that he does not use drugs.  ALLERGIES: Tylox [oxycodone-acetaminophen]  MEDICATIONS:  Current Outpatient Medications  Medication Sig Dispense Refill  . acetaminophen (TYLENOL) 325 MG tablet Take 650 mg by mouth every 6 (six) hours as needed.    . fexofenadine (ALLEGRA) 180 MG tablet Take by mouth.    . hydrochlorothiazide (HYDRODIURIL) 25 MG tablet Take 25 mg by mouth daily.    Marland Kitchen ibuprofen (ADVIL,MOTRIN) 200 MG tablet Take 200 mg by mouth every 6 (six) hours as needed.    Marland Kitchen lisinopril (ZESTRIL) 10 MG tablet Take 10 mg by mouth daily.    Marland Kitchen loratadine (CLARITIN) 10 MG tablet Take by mouth.     No current facility-administered medications for this encounter.     ECOG PERFORMANCE STATUS:  0 - Asymptomatic  REVIEW OF SYSTEMS: Patient denies any weight loss, fatigue, weakness, fever, chills or night sweats. Patient denies any loss of vision, blurred vision. Patient denies any ringing  of the ears or hearing loss. No irregular heartbeat. Patient denies heart murmur or history of fainting. Patient denies any chest pain or pain radiating to her  upper extremities. Patient denies any shortness of breath, difficulty breathing at night, cough or hemoptysis. Patient denies any swelling in the lower legs. Patient denies any nausea vomiting, vomiting of blood, or coffee ground material in the vomitus. Patient denies any stomach pain. Patient states has had normal bowel movements no significant constipation or diarrhea. Patient denies any dysuria, hematuria or significant nocturia. Patient denies any problems  walking, swelling in the joints or loss of balance. Patient denies any skin changes, loss of hair or loss of weight. Patient denies any excessive worrying or anxiety or significant depression. Patient denies any problems with insomnia. Patient denies excessive thirst, polyuria, polydipsia. Patient denies any swollen glands, patient denies easy bruising or easy bleeding. Patient denies any recent infections, allergies or URI. Patient "s visual fields have not changed significantly in recent time.   PHYSICAL EXAM: BP 123/81 (BP Location: Left Arm, Patient Position: Sitting)   Pulse 88   Temp 97.6 F (36.4 C) (Tympanic)   Resp 16   Wt 282 lb 6.4 oz (128.1 kg)   BMI 40.52 kg/m  Mildly obese male in NAD.  Well-developed well-nourished patient in NAD. HEENT reveals PERLA, EOMI, discs not visualized.  Oral cavity is clear. No oral mucosal lesions are identified. Neck is clear without evidence of cervical or supraclavicular adenopathy. Lungs are clear to A&P. Cardiac examination is essentially unremarkable with regular rate and rhythm without murmur rub or thrill. Abdomen is benign with no organomegaly or masses noted. Motor sensory and DTR levels are equal and symmetric in the upper and lower extremities. Cranial nerves II through XII are grossly intact. Proprioception is intact. No peripheral adenopathy or edema is identified. No motor or sensory levels are noted. Crude visual fields are within normal range.  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  It still shows a 50% chance of extracapsular extension 11% chance of lymph node involvement and 11% chance of seminal vesicle involvement.  I still believe patient would benefit from  robotic prostatectomy and if there is biochemical failure or positive margins we can salvage with radiation therapy.  He is young of age and in good good health.  I have explained to him the risks and benefits of radiation therapy and the fact that there is no salvage treatment after radiation although with surgery should he recur we can still salvage with 80 to 90% cure rate using radiation therapy.  I believe believe the patient is leaning towards robotic prostatectomy and will be contacting urology to schedule that in the near future.  I also await his MRI scans for better delineation of what the lesions in the adrenal and kidney are.  I would like to take this opportunity to thank you for allowing me to participate in the care of your patient.Noreene Filbert, MD

## 2018-11-28 ENCOUNTER — Other Ambulatory Visit: Payer: Self-pay

## 2018-11-28 ENCOUNTER — Ambulatory Visit
Admission: RE | Admit: 2018-11-28 | Discharge: 2018-11-28 | Disposition: A | Discharge status: Home / Self Care | Source: Ambulatory Visit | Attending: Urology | Admitting: Urology

## 2018-11-28 DIAGNOSIS — N2889 Other specified disorders of kidney and ureter: Secondary | ICD-10-CM

## 2018-11-28 DIAGNOSIS — E278 Other specified disorders of adrenal gland: Secondary | ICD-10-CM

## 2018-11-28 MED ORDER — GADOBUTROL 1 MMOL/ML IV SOLN: 10.0000 mL | Freq: Once | INTRAVENOUS | Status: DC | PRN

## 2018-11-28 NOTE — Telephone Encounter (Signed)
App made  Patient is aware   Patrick Hinton

## 2018-11-28 NOTE — Telephone Encounter (Signed)
-----   Message from Abbie Sons, MD sent at 11/27/2018 11:06 AM EDT ----- Regarding: Appointment Please schedule appointment with Dr. Erlene Quan or Dr. Diamantina Providence to discuss robotic prostatectomy

## 2018-11-29 ENCOUNTER — Encounter: Payer: Self-pay | Admitting: Urology

## 2018-12-05 ENCOUNTER — Other Ambulatory Visit: Payer: Self-pay

## 2018-12-05 ENCOUNTER — Ambulatory Visit (INDEPENDENT_AMBULATORY_CARE_PROVIDER_SITE_OTHER): Admitting: Urology

## 2018-12-05 ENCOUNTER — Other Ambulatory Visit: Payer: Self-pay | Admitting: Urology

## 2018-12-05 ENCOUNTER — Encounter: Payer: Self-pay | Admitting: Urology

## 2018-12-05 VITALS — BP 122/75 | HR 97 | Ht 70.0 in | Wt 275.0 lb

## 2018-12-05 DIAGNOSIS — C61 Malignant neoplasm of prostate: Secondary | ICD-10-CM | POA: Diagnosis not present

## 2018-12-05 DIAGNOSIS — N2889 Other specified disorders of kidney and ureter: Secondary | ICD-10-CM

## 2018-12-05 DIAGNOSIS — E278 Other specified disorders of adrenal gland: Secondary | ICD-10-CM

## 2018-12-05 NOTE — Patient Instructions (Signed)
Robot-Assisted Laparoscopic Prostatectomy  Robot-assisted laparoscopic prostatectomy is a minimally invasive procedure to remove the entire prostate gland and the seminal vesicles, which are near the bladder and the prostate. This procedure is done to treat prostate cancer that has not spread to other parts of the body (has not metastasized). The goal of the procedure is to remove all cancer cells and to prevent prostate cancer from metastasizing. During your robot-assisted laparoscopic prostatectomy, your surgeon will use a thin, lighted tube with a tiny camera on the end (laparoscope). The laparoscope will allow your surgeon to do the surgery with several small incisions in the abdomen instead of a large incision. Your surgeon will also use a robotic arm during the procedure that will be operated through a computer. During your procedure, the lymph glands in your pelvis may be removed. Lymph glands are part of your body's disease-fighting system (immune system). The lymph glands in the pelvis may be the first place that is affected by the spreading (metastasis) of prostate cancer. If your pelvic lymph glands are removed, tissue from the glands will be tested for cancer cells. Tell a health care provider about:  Any allergies you have.  All medicines you are taking, including vitamins, herbs, eye drops, creams, and over-the-counter medicines.  Any problems you or family members have had with anesthetic medicines.  Any blood disorders you have.  Any surgeries you have had.  Any medical conditions you have.  Any prostate infections you have had. What are the risks? Generally, this is a safe procedure. However, problems may occur, including:  Infection.  Bleeding.  Allergic reactions to medicines.  Inability to control when you urinate (incontinence). This is usually a temporary effect of this procedure.  Blockage (obstruction) of the large or small intestines.  Narrowing or scarring  of the urethra (stricture), which may block the flow of urine.  Inability to get or sustain an erection (erectile dysfunction).  Dry ejaculation. This is when no semen is released during orgasm.  Blood clots in the legs.  The formation of a sac (cyst) in the pelvis that is filled with fluid from the lymph glands (lymphocele). There is also a risk of damage to other structures or organs, such as:  The tubes that drain urine from the kidneys to the bladder (ureters).  The bladder.  The urethra.  The large or small intestines.  The rectum.  Vascular arteries or veins.  Nerve tissue. What happens before the procedure? Staying hydrated Follow instructions from your health care provider about hydration, which may include:  Up to 2 hours before the procedure - you may continue to drink clear liquids, such as water, clear fruit juice, black coffee, and plain tea. Eating and drinking restrictions Follow instructions from your health care provider about eating and drinking, which may include:  8 hours before the procedure - stop eating heavy meals or foods such as meat, fried foods, or fatty foods.  6 hours before the procedure - stop eating light meals or foods, such as toast or cereal.  6 hours before the procedure - stop drinking milk or drinks that contain milk.  2 hours before the procedure - stop drinking clear liquids. Medicines  Ask your health care provider about: ? Changing or stopping your regular medicines. This is especially important if you are taking diabetes medicines or blood thinners. ? Taking medicines such as aspirin and ibuprofen. These medicines can thin your blood. Do not take these medicines before your procedure if your health  care provider instructs you not to.  You may be given antibiotic medicine to help prevent infection. General instructions  Ask your health care provider how your surgical site will be marked or identified.  You may have an exam  or testing.  You may have additional tests, such as a CT scan or MRI.  You may have a blood or urine sample taken. What happens during the procedure?  To reduce your risk of infection: ? Your health care team will wash or sanitize their hands. ? Your skin will be washed with soap. ? Hair will be removed from your surgical area.  An IV tube will be inserted into one of your veins.  You will be given one or more of the following: ? A medicine to help you relax (sedative). ? A medicine to make you fall asleep (general anesthetic). ? A medicine that is injected into your spine to numb the area below and slightly above the injection site (spinal anesthetic).  A thin, flexible tube (catheter) will be inserted through your urethra and into your bladder. The catheter will drain urine from your bladder during the procedure and while you heal.  Four or five small incisions will be made in your abdomen.  The laparoscope and other surgical instruments will be put through the incisions. Your surgeon will use the laparoscope and a robotic arm to help control the surgical instruments.  Your urethra will be cut and separated from your bladder, and your prostate and seminal vesicles will be removed.  Your pelvic lymph glands may also be removed for testing.  Your urethra will be reconnected to your bladder. This will be done so your catheter is still in place inside of your urethra.  A small tube (drain) may be placed in one or more of your incisions to help drain extra fluid from your surgical site.  Your incisions will be closed with stitches (sutures), skin glue, or adhesive strips.  Medicine may be applied to your incisions.  Bandages (dressings) will be placed over your incisions. What happens after the procedure?  Your blood pressure, heart rate, breathing rate, and blood oxygen level will be monitored until the medicines you were given have worn off.  You may continue to receive  fluids and medicines through an IV tube.  You will have some pain. Pain medicines will be available to help you.  You will have a catheter to drain urine from your bladder.  You may have fluid coming from a drain in your incision.  You may have to wear compression stockings. These stockings help to prevent blood clots and reduce swelling in your legs.  You will be encouraged to move around as much as possible. This information is not intended to replace advice given to you by your health care provider. Make sure you discuss any questions you have with your health care provider. Document Released: 10/27/2011 Document Revised: 01/14/2017 Document Reviewed: 11/14/2015 Elsevier Patient Education  2020 [employer] prostate is a walnut-sized gland that is involved in the production of semen. It is located below a man's bladder, in front of the rectum. Prostate cancer is the abnormal growth of cells in the prostate gland. What are the causes? The exact cause of this condition is not known. What increases the risk? This condition is more likely to develop in men who:  Are older than age 8.  Are African-American.  Are obese.  Have a family history of prostate cancer.  Have a family history of breast cancer. What are the signs or symptoms? Symptoms of this condition include:  A need to urinate often.  Weak or interrupted flow of urine.  Trouble starting or stopping urination.  Inability to urinate.  Pain or burning during urination.  Painful ejaculation.  Blood in urine or semen.  Persistent pain or discomfort in the lower back, lower abdomen, hips, or upper thighs.  Trouble getting an erection.  Trouble emptying the bladder all the way. How is this diagnosed? This condition can be diagnosed with:  A digital rectal exam. For this exam, a health care provider inserts a gloved finger into the rectum to feel the prostate gland.  A blood test  called a prostate-specific antigen (PSA) test.  An imaging test called transrectal ultrasonography.  A procedure in which a sample of tissue is taken from the prostate and examined under a microscope (prostate biopsy). Once the condition is diagnosed, tests will be done to determine how far the cancer has spread. This is called staging the cancer. Staging may involve imaging tests, such as:  A bone scan.  A CT scan.  A PET scan.  An MRI. The stages of prostate cancer are as follows:  Stage I. At this stage, the cancer is found in the prostate only. The cancer is not visible on imaging tests and it is usually found by accident, such as during a prostate surgery.  Stage II. At this stage, the cancer is more advanced than it is in stage I, but the cancer has not spread outside the prostate.  Stage III. At this stage, the cancer has spread beyond the outer layer of the prostate to nearby tissues. The cancer may be found in the seminal vesicles, which are near the bladder and the prostate.  Stage IV. At this stage, the cancer has spread other parts of the body, such as the lymph nodes, bones, bladder, rectum, liver, or lungs. How is this treated? Treatment for this condition depends on several factors, including the stage of the cancer, your age, personal preferences, and your overall health. Talk with your health care provider about treatment options that are recommended for you. Common treatments include:  Observation for early stage prostate cancer (active surveillance). This involves having exams, blood tests, and in some cases, more biopsies. For some men, this is the only treatment needed.  Surgery. Types of surgeries include: ? Open surgery. In this surgery, a larger incision is made to remove the prostate. ? A laparoscopic prostatectomy. This is a surgery to remove the prostate and lymph nodes through several, small incisions. It is often referred to as a minimally invasive surgery.  ? A robotic prostatectomy. This is a surgery to remove the prostate and lymph nodes with the help of a robotic arm that is controlled by a computer. ? Orchiectomy. This is a surgery to remove the testicles. ? Cryosurgery. This is a surgery to freeze and destroy cancer cells.  Radiation treatment. Types of radiation treatment include: ? External beam radiation. This type aims beams of radiation from outside the body at the prostate to destroy cancerous cells. ? Brachytherapy. This type uses radioactive needles, seeds, wires, or tubes that are implanted into the prostate gland. Like external beam radiation, brachytherapy destroys cancerous cells. An advantage is that this type of radiation limits the damage to surrounding tissue and has fewer side effects.  High-intensity, focused ultrasonography. This treatment destroys cancer cells by delivering high-energy ultrasound waves to the cancerous cells.  Chemotherapy medicines. This treatment kills cancer cells or stops them from multiplying.  Hormone treatment. This treatment involves taking medicines that act on one of the male hormones (testosterone): ? By stopping your body from producing testosterone. ? By blocking testosterone from reaching cancer cells. Follow these instructions at home:  Take over-the-counter and prescription medicines only as told by your health care provider.  Maintain a healthy diet.  Get plenty of sleep.  Consider joining a support group for men who have prostate cancer. Meeting with a support group may help you learn to cope with the stress of having cancer.  Keep all follow-up visits as told by your health care provider. This is important.  If you have to go to the hospital, notify your cancer specialist (oncologist).  Treatment for prostate cancer may affect sexual function. Continue to have intimate moments with your partner. This may include touching, holding, hugging, and caressing. Contact a health care  provider if:  You have trouble urinating.  You have blood in your urine.  You have pain in your hips, back, or chest. Get help right away if:  You have weakness or numbness in your legs.  You cannot control urination or your bowel movements (incontinence).  You have trouble breathing.  You have sudden chest pain.  You have chills or a fever. Summary  The prostate is a walnut-sized gland that is involved in the production of semen. It is located below a man's bladder, in front of the rectum. Prostate cancer is the abnormal growth of cells in the prostate gland.  Treatment for this condition depends on several factors, including the stage of the cancer, your age, personal preferences, and your overall health. Talk with your health care provider about treatment options that are recommended for you.  Consider joining a support group for men who have prostate cancer. Meeting with a support group may help you learn to cope with the stress of having cancer. This information is not intended to replace advice given to you by your health care provider. Make sure you discuss any questions you have with your health care provider. Document Released: 02/01/2005 Document Revised: 01/14/2017 Document Reviewed: 10/13/2015 Elsevier Patient Education  2020 [employer].

## 2018-12-05 NOTE — Progress Notes (Signed)
° °  12/05/2018 2:03 PM   Antonietta Breach 1960-09-15 [id number]  Reason for visit: Discuss surgery for prostate cancer  HPI: I saw Mr. Sellman in urology clinic today to discuss his new diagnosis of prostate cancer.  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 Bernardo Heater on 10/09/2018 with a PSA of 6.  He had a CT abdomen pelvis with contrast on 11/09/2018 that showed no evidence of metastatic disease, but there was an indeterminate lesion in the lower pole of the left kidney that measured 2 cm, and he is planning to undergo an MRI for further work-up.  He denies any prior abdominal surgeries.  He denies any urinary symptoms at baseline.  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  He already met with Dr. Donella Stade to discuss the risks and benefits of radiation, and he is interested in learning more about robotic prostatectomy.  In regards to surgery, we discussed robotic prostatectomy + lymphadenectomy at length.  The procedure takes 3 to 4 hours, and patient's typically discharge home on post-op day #1.  A Foley catheter is left in place for 7 to 10 days to allow for healing of the vesicourethral anastomosis.  There is a small risk of bleeding, infection, damage to surrounding structures or bowel, hernia, DVT/PE, or serious cardiac or pulmonary complications.  We discussed at length post-op side effects including erectile dysfunction, and the importance of pre-operative erectile function on long-term outcomes.  Even with a nerve sparing approach, there is an approximately 25% rate of permanent erectile dysfunction.  We also discussed postop urinary incontinence at length.  We expect patients to have stress incontinence post-operatively that will improve over period of weeks to months.  Less than 10% of men will require a pad at 1 year after surgery.  Patients will need to avoid heavy lifting and strenuous activity for 3 to 4 weeks, but most men  return to their baseline activity status by 6 weeks.  I was also very frank with the patient that neither myself or my partner Dr. Erlene Quan typically offer robotic prostatectomy to patient's with a BMI over 35 because of the increased risk of complications, as well as difficulty with ventilation in Trendelenburg position.  His BMI is currently 39.5, and he will work to lose some weight.  He is unsure how he would like to proceed at this time.  I recommended he continue to do some research on which treatment strategy he would like to pursue.  We discussed that the cure rate is very similar between radiation and surgery, however radiation can be used as a salvage tool after surgery, whereas prostatectomy is very rarely offered after radiation for salvage in the event of recurrence.  Virtual visit 1 month when patient has decided between radiation and surgery  A total of 25 minutes were spent face-to-face with the patient, greater than 50% was spent in patient education, counseling, and coordination of care regarding unfavorable intermediate risk prostate cancer, surgery, and radiation.  Billey Co, [facility] [address] [place], [place] [address] [phone]

## 2018-12-15 ENCOUNTER — Other Ambulatory Visit: Payer: Self-pay | Admitting: Urology

## 2018-12-15 MED ORDER — DIAZEPAM 10 MG PO TABS: ORAL_TABLET | ORAL | 0 refills | Status: AC

## 2018-12-15 NOTE — Progress Notes (Signed)
Rx Valium prior to MRI.  Patient was informed he would need a driver.

## 2018-12-25 ENCOUNTER — Ambulatory Visit: Admit: 2018-12-25 | Admitting: Internal Medicine

## 2018-12-25 SURGERY — COLONOSCOPY WITH PROPOFOL
Anesthesia: General

## 2019-01-03 ENCOUNTER — Encounter: Payer: Self-pay | Admitting: Urology

## 2019-01-08 ENCOUNTER — Other Ambulatory Visit: Payer: Self-pay | Admitting: Urology

## 2019-01-09 ENCOUNTER — Telehealth (INDEPENDENT_AMBULATORY_CARE_PROVIDER_SITE_OTHER): Admitting: Urology

## 2019-01-09 ENCOUNTER — Telehealth: Admitting: Urology

## 2019-01-09 DIAGNOSIS — C61 Malignant neoplasm of prostate: Secondary | ICD-10-CM | POA: Diagnosis not present

## 2019-01-09 NOTE — Progress Notes (Signed)
Virtual Visit via Telephone Note  I connected with Patrick Hinton on 01/09/19 at  3:00 PM EST by telephone and verified that I am speaking with the correct person using two identifiers.   I discussed the limitations, risks, security and privacy concerns of performing an evaluation and management service by telephone and the availability of in person appointments. We discussed the impact of the COVID-19 on the healthcare system, and the importance of social distancing and reducing patient and provider exposure. I also discussed with the patient that there may be a patient responsible charge related to this service. The patient expressed understanding and agreed to proceed.  Reason for visit: Discuss prostate cancer  History of Present Illness: I had phone follow-up with Patrick Hinton to discuss his prostate cancer again.  Briefly, he is a 58 year old male that was previously followed by Dr. Bernardo Heater and was diagnosed with unfavorable intermediate risk prostate cancer on 10/09/2018 with a PSA of 6.  Staging imaging with a CT abdomen pelvis showed no evidence of metastatic disease, however there is an indeterminate lesion in the lower pole of the left kidney and measure 2 cm, and an MRI for further work-up is still pending.  He is already met with Dr. Baruch Gouty in radiation oncology regarding treatment options with radiation, and he prefers surgery with his young age, risk for recurrence, and ability to get salvage radiation in future.  We discussed robotic prostatectomy + lymphadenectomy at length again.  The procedure takes 3 to 4 hours, and patient's typically discharge home on post-op day #1.  A Foley catheter is left in place for 7 to 10 days to allow for healing of the vesicourethral anastomosis.  There is a small risk of bleeding, infection, damage to surrounding structures or bowel, hernia, DVT/PE, or serious cardiac or pulmonary complications.  We discussed at length post-op side effects including  erectile dysfunction, and the importance of pre-operative erectile function on long-term outcomes.  Even with a nerve sparing approach, there is an approximately 25% rate of permanent erectile dysfunction.  We also discussed postop urinary incontinence at length.  We expect patients to have stress incontinence post-operatively that will improve over period of weeks to months.  Less than 10% of men will require a pad at 1 year after surgery.  Patients will need to avoid heavy lifting and strenuous activity for 3 to 4 weeks, but most men return to their baseline activity status by 6 weeks.  I was very frank with the patient that neither myself or my partner Dr. Erlene Quan typically offer robotic prostatectomy to patient's with a BMI over 35 because of the increased risk of complications, as well as difficulty with ventilation in Trendelenburg position.  His BMI is currently 39.5.  I again encouraged him to lose weight with exercise and healthy eating to minimize his risk of operative complications.   I recommended evaluation at [facility] to see if they are willing to perform robotic prostatectomy with his BMI of almost 40.  Referral has been placed.  RTC mid January 2020 to confirm treatment decision for prostate cancer  I discussed the assessment and treatment plan with the patient. The patient was provided an opportunity to ask questions and all were answered. The patient agreed with the plan and demonstrated an understanding of the instructions.   The patient was advised to call back or seek an in-person evaluation if the symptoms worsen or if the condition fails to improve as anticipated.  I provided 15 minutes of  non-face-to-face time during this encounter.   Billey Co, MD

## 2019-01-10 ENCOUNTER — Other Ambulatory Visit: Payer: Self-pay | Admitting: Family Medicine

## 2019-01-10 DIAGNOSIS — C61 Malignant neoplasm of prostate: Secondary | ICD-10-CM

## 2019-01-23 ENCOUNTER — Telehealth: Payer: Self-pay | Admitting: Urology

## 2019-01-23 DIAGNOSIS — N2889 Other specified disorders of kidney and ureter: Secondary | ICD-10-CM

## 2019-01-23 NOTE — Telephone Encounter (Signed)
Mr. Haenel had a CT 11/09/2018 as part of a metastatic evaluation for prostate cancer and was incidentally found to have a 2.1 x 1.9 cm indeterminate lesion in the lower pole left kidney.  MRI with and without contrast was recommended for further evaluation.  He also had a 1.2 x 1.5 cm incompletely characterized left adrenal mass.  He elected to have his MRI performed at [facility] on 11/23.  The left renal lesion was interpreted as a contour irregularity measuring 2.2 x 1.8 cm and was interpreted as a possible mass in the left kidney.  The left adrenal lesion was consistent with an adrenal adenoma.  We discussed the MRI results and have recommended a follow-up renal mass protocol CT in 4 to 6 months.  He is scheduled to see Dr. Tamala Julian at [facility] urology in late January to discuss radical prostatectomy.

## 2019-03-08 ENCOUNTER — Ambulatory Visit: Admitting: Urology

## 2019-03-12 ENCOUNTER — Ambulatory Visit: Payer: PRIVATE HEALTH INSURANCE | Admitting: Urology

## 2019-06-26 ENCOUNTER — Telehealth: Payer: Self-pay | Admitting: Urology

## 2019-06-26 NOTE — Telephone Encounter (Signed)
patient said he did not need the CT scan you ordered because he just had an MRI at [facility] done and cx his follow up appt. said he would cb later to reschd if needed

## 2019-07-12 ENCOUNTER — Ambulatory Visit: Admitting: Urology

## 2019-10-13 IMAGING — CR DG CHEST 2V
2 series · 2 of 2 positions shown · non-contrast
Comparison: None.

CLINICAL DATA: Shortness of breath

EXAM:
CHEST - 2 VIEW

[chest pa]
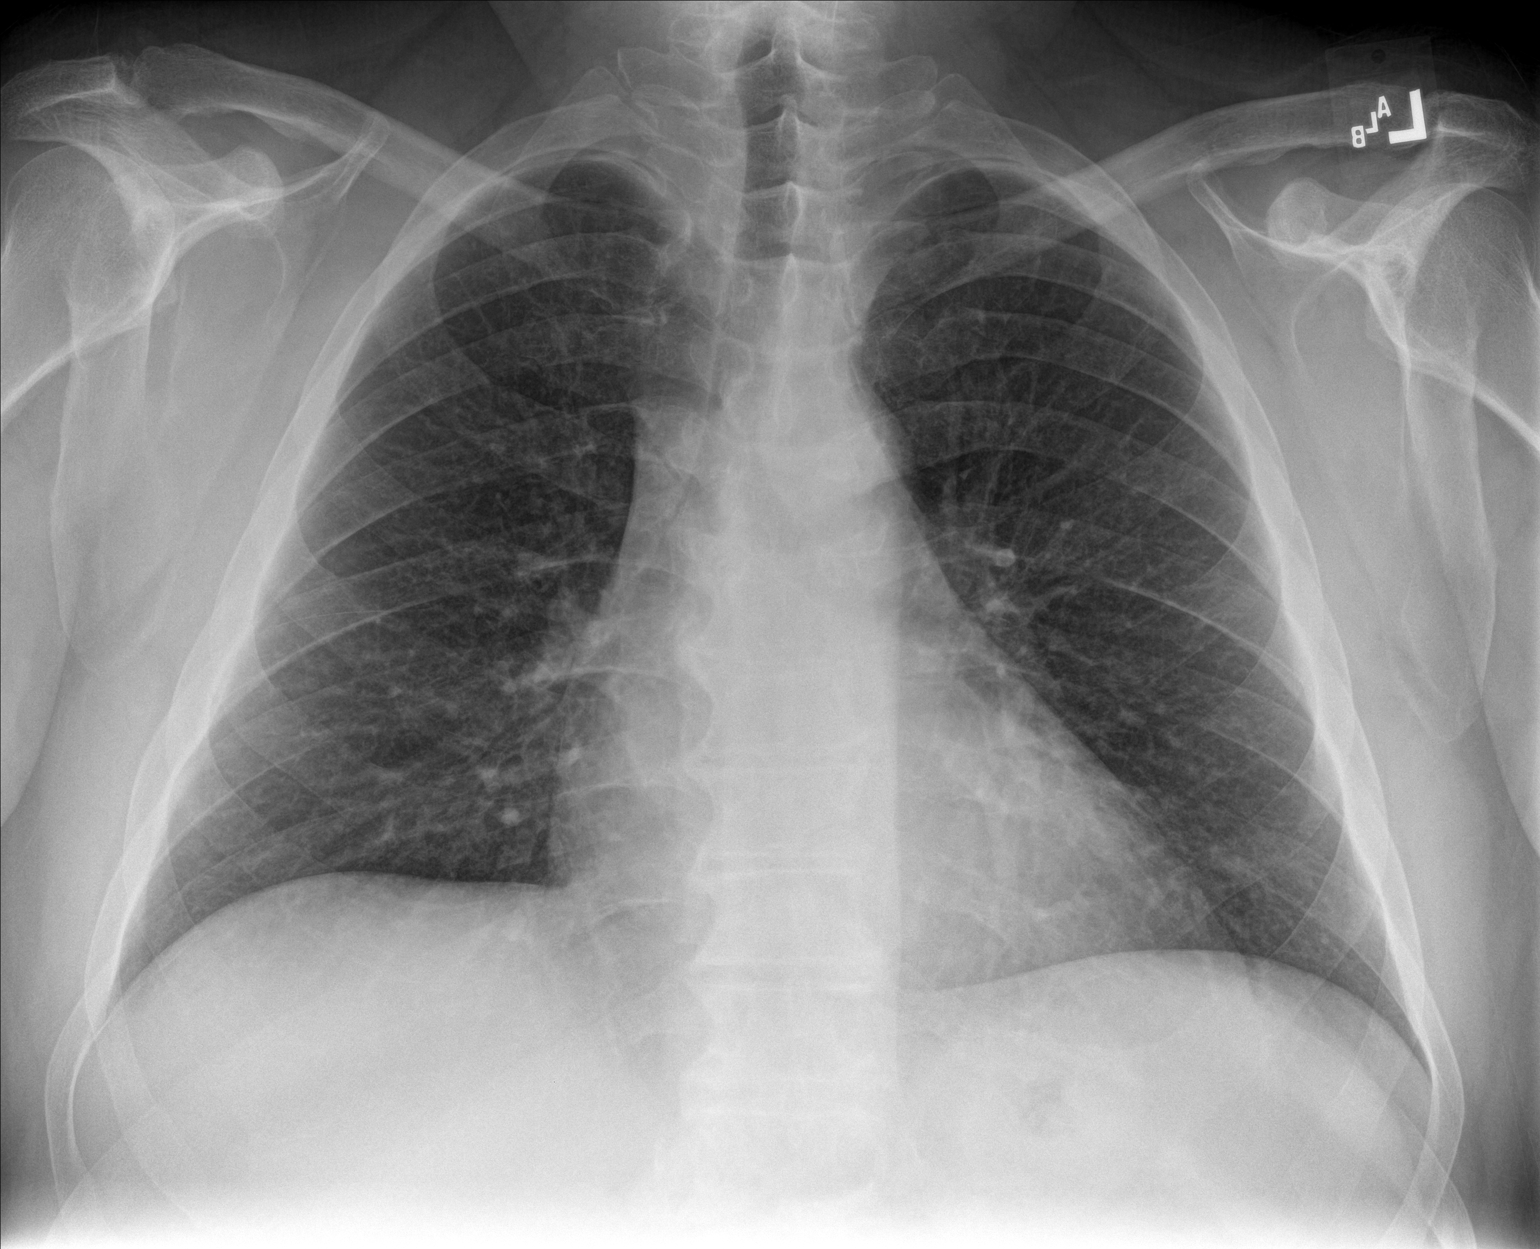

[chest lat]
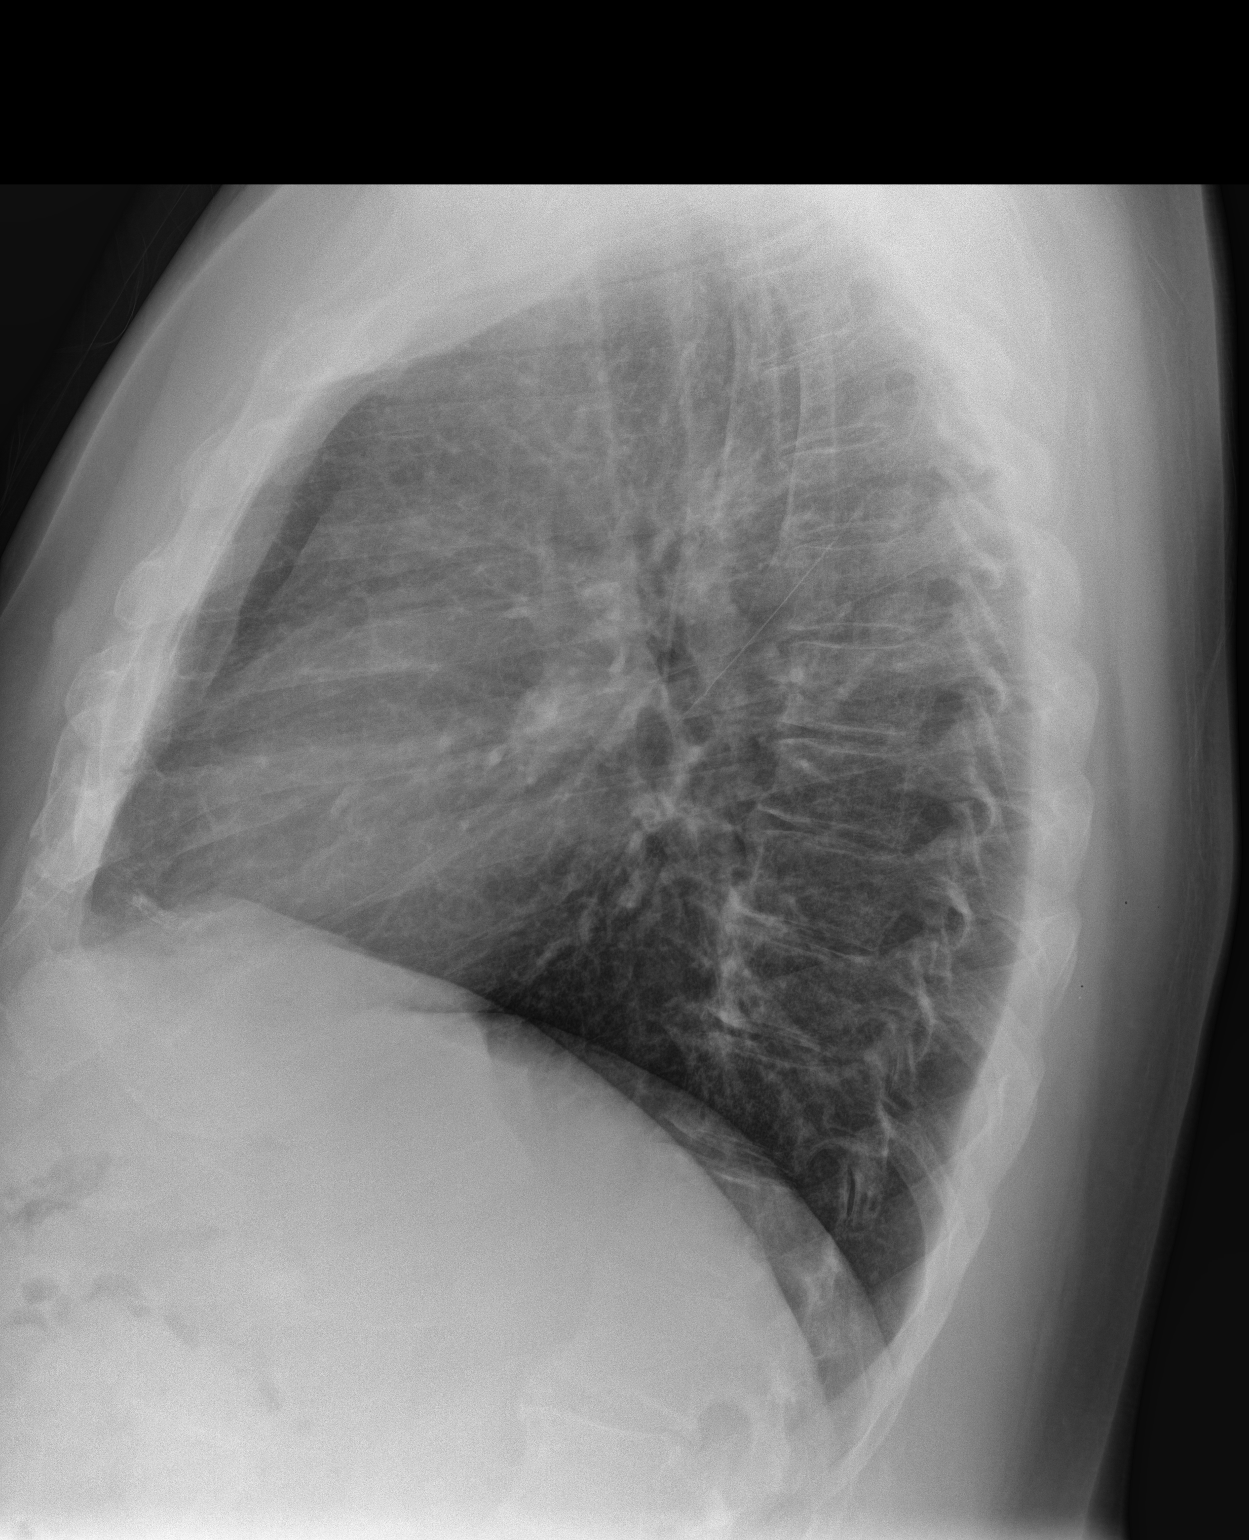

[2 of 2 positions shown; findings below may reference images not displayed]

FINDINGS: The heart size and mediastinal contours are within normal limits.
Both lungs are clear. Disc degenerative disease of the thoracic
spine.
IMPRESSION: No acute abnormality of the lungs.

## 2019-10-13 IMAGING — CT CT ABD-PELV W/ CM
2 of 5 series · 15 of 46 positions shown, 17 images · IV contrast (omnipaque)
Comparison: CT the abdomen and pelvis 09/24/2014.

CLINICAL DATA: 58-year-old male with history of prostate cancer.

EXAM:
CT ABDOMEN AND PELVIS WITH CONTRAST
TECHNIQUE: Multidetector CT imaging of the abdomen and pelvis was performed
using the standard protocol following bolus administration of
intravenous contrast.
CONTRAST:  125mL OMNIPAQUE IOHEXOL 300 MG/ML  SOLN

[Series 2: routine abd/pel with · axial · 0.82mm/px · z∈[-552,-107]mm · 12 of 101 slices shown, 14 images]
[im 6/101  soft-tissue]
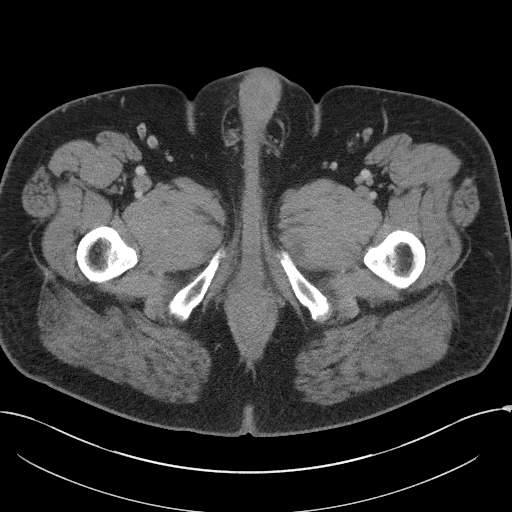
[im 6/101  bone]
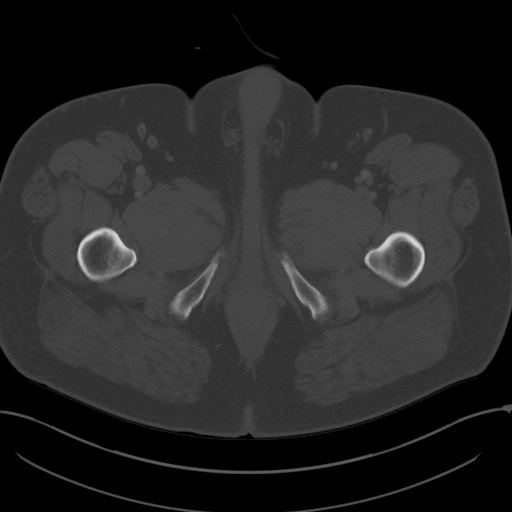
[im 16/101  soft-tissue]
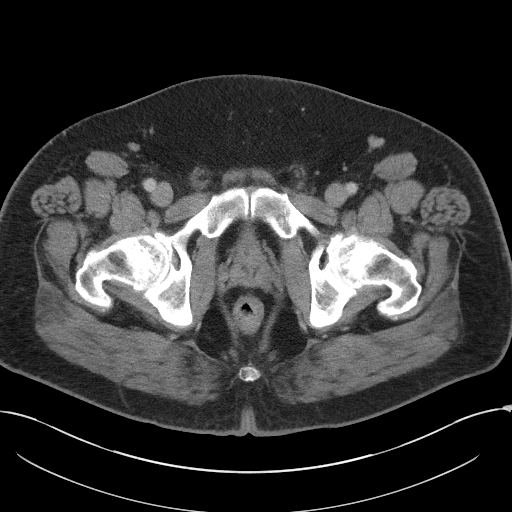
[im 22/101  soft-tissue]
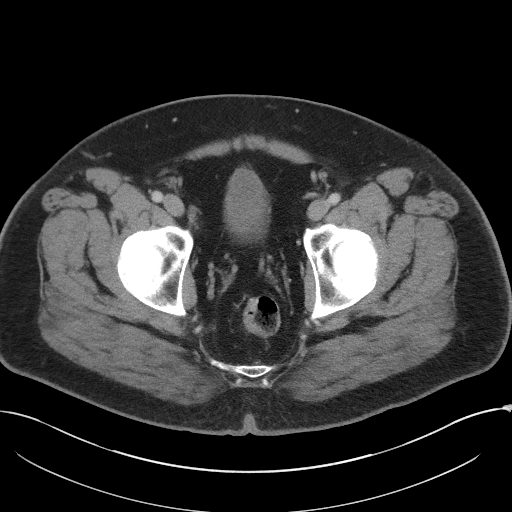
[im 32/101  soft-tissue]
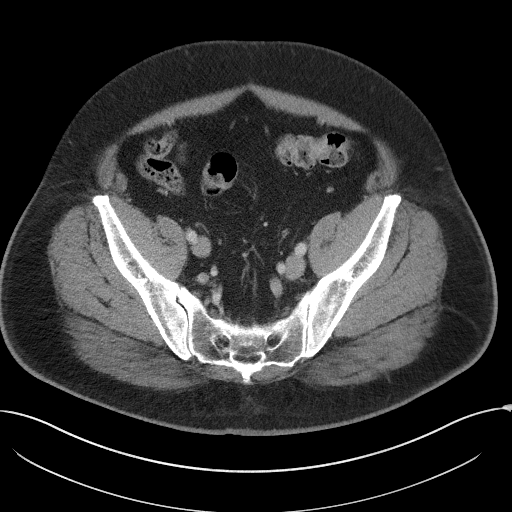
[im 37/101  soft-tissue]
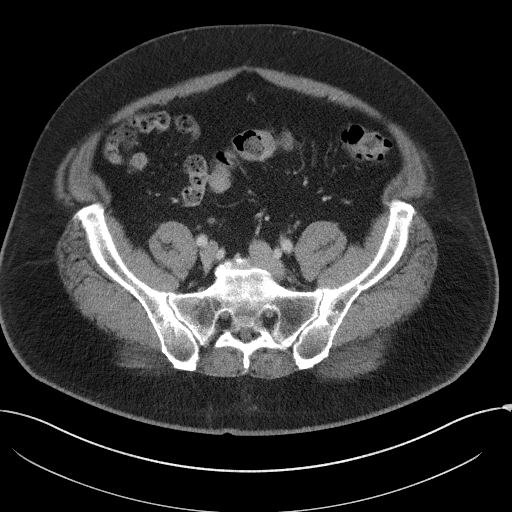
[im 48/101  soft-tissue]
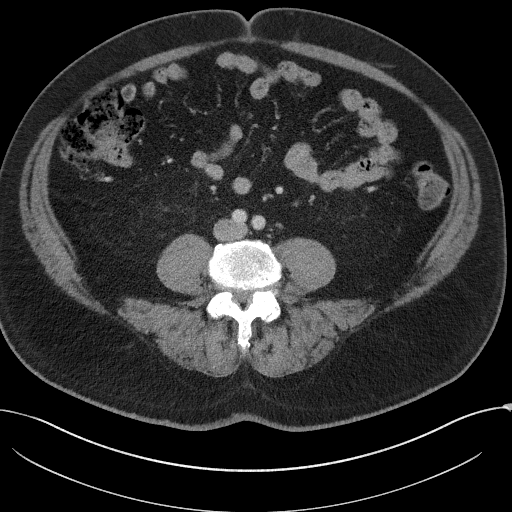
[im 53/101  soft-tissue]
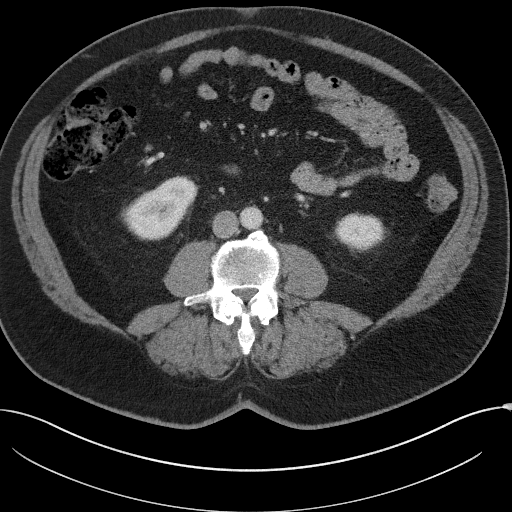
[im 64/101  soft-tissue]
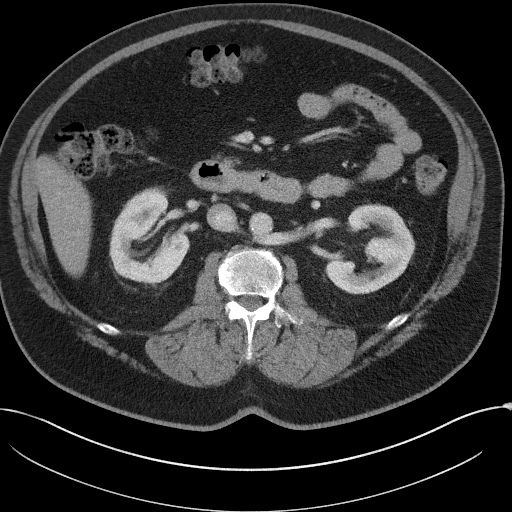
[im 69/101  soft-tissue]
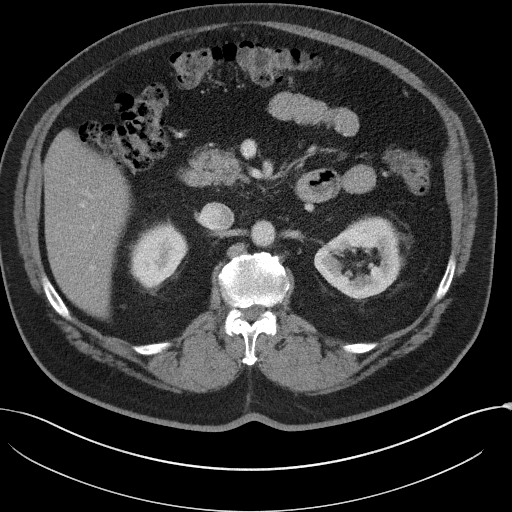
[im 69/101  bone]
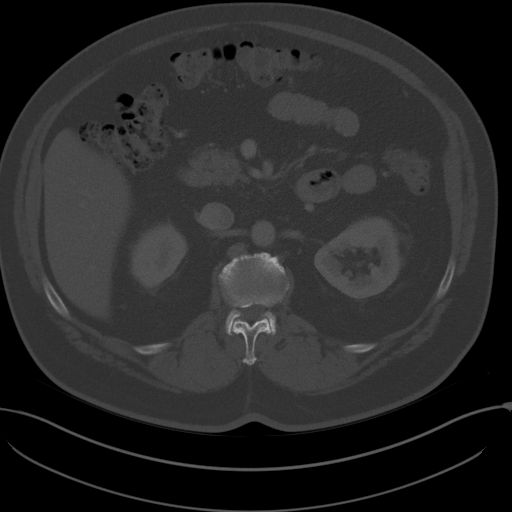
[im 79/101  soft-tissue]
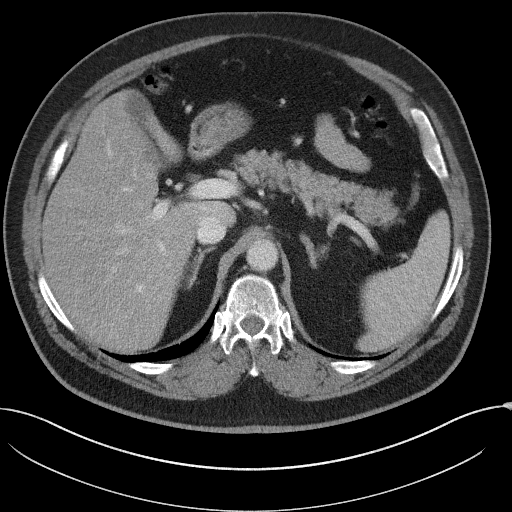
[im 85/101  soft-tissue]
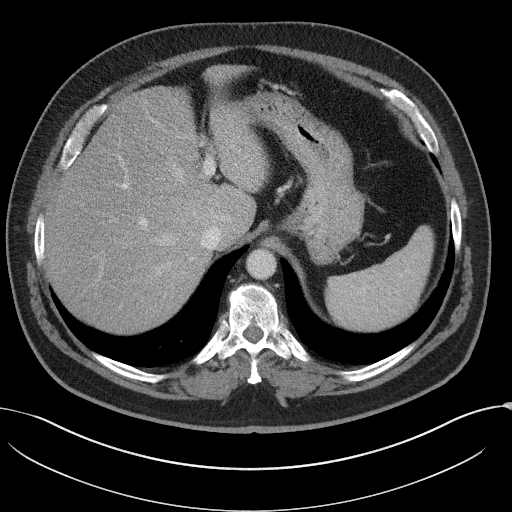
[im 95/101  soft-tissue]
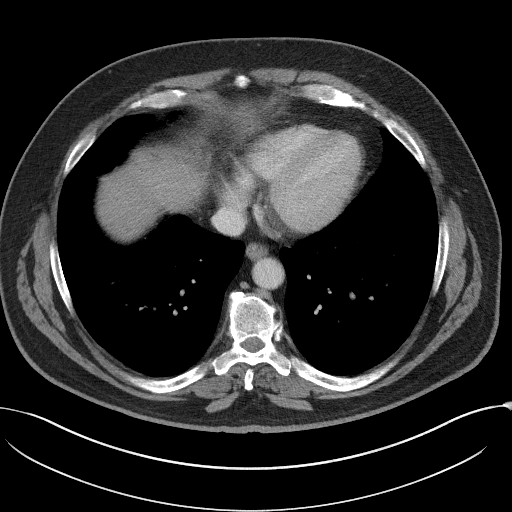

[Series 5: coronal st · coronal · 0.87mm/px · 3 of 119 slices shown]
[im 40/119  soft-tissue]
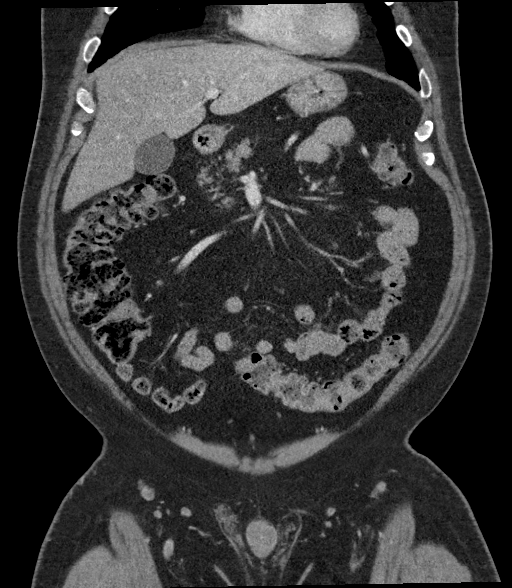
[im 53/119  soft-tissue]
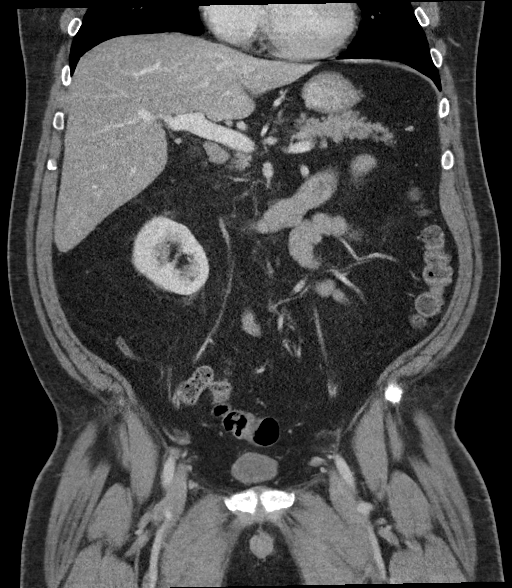
[im 66/119  soft-tissue]
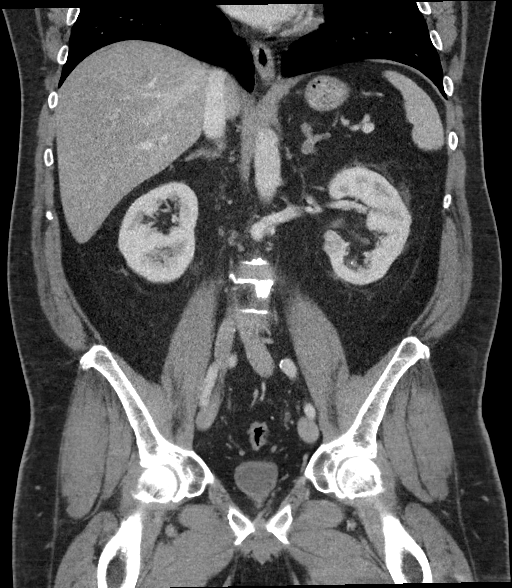

[15 of 46 positions shown; findings below may reference images not displayed]

FINDINGS: Lower chest: Unremarkable.

Hepatobiliary: No suspicious cystic or solid hepatic lesions. No
intra or extrahepatic biliary ductal dilatation. Gallbladder is
normal in appearance.

Pancreas: No pancreatic mass. No pancreatic ductal dilatation. No
pancreatic or peripancreatic fluid collections or inflammatory
changes.

Spleen: Unremarkable.

Adrenals/Urinary Tract: In the medial aspect of the lower pole of
the left kidney (axial image 22 of series 7) there is a 2.1 x 1.9 cm
lesion which measures 99 HU on portal venous phase imaging
decreasing to 77 HU on delayed imaging, incompletely characterized
but suspicious. Other subcentimeter low-attenuation lesions in the
left kidney are too small to characterize, but favored to represent
tiny cysts. Right kidney and right adrenal gland is normal in
appearance. 1.2 x 1.5 cm nodule in the medial limb of the left
adrenal gland incompletely characterized, but new compared to the
prior study. No hydroureteronephrosis. Urinary bladder is normal in
appearance.

Stomach/Bowel: Normal appearance of the stomach. No pathologic
dilatation of small bowel or colon. Normal appendix.

Vascular/Lymphatic: Aortic atherosclerosis, without evidence of
aneurysm or dissection in the abdominal or pelvic vasculature.
Retroaortic left renal vein (normal anatomical variant) incidentally
noted. No lymphadenopathy identified in the abdomen or pelvis.

Reproductive: Prostate gland and seminal vesicles are grossly
unremarkable in appearance.

Other: No significant volume of ascites.  No pneumoperitoneum.

Musculoskeletal: There are no aggressive appearing lytic or blastic
lesions noted in the visualized portions of the skeleton.
IMPRESSION: 1. 1.5 x 1.2 cm indeterminate left adrenal nodule, new compared to
prior study 09/24/2014. Although this could represent a benign
lesion such as a small adenoma, the possibility of a metastatic
lesion is not excluded, and close attention on follow-up studies is
recommended.
2. In addition, there is an indeterminate lesion in the lower pole
of the left kidney which warrants further evaluation to exclude the
possibility of neoplasm. This measures 2.1 x 1.9 cm. Further
evaluation with nonemergent MRI of the abdomen with and without IV
gadolinium is recommended in the near future to characterize this
lesion. Attention at the time of that examination to the left
adrenal lesion is also recommended.
3. Aortic atherosclerosis.

## 2020-04-27 ENCOUNTER — Encounter: Payer: Self-pay | Admitting: Urology

## 2020-04-29 ENCOUNTER — Other Ambulatory Visit: Payer: Self-pay | Admitting: Urology

## 2020-04-29 DIAGNOSIS — N2889 Other specified disorders of kidney and ureter: Secondary | ICD-10-CM

## 2020-05-27 ENCOUNTER — Other Ambulatory Visit: Payer: Self-pay | Admitting: Urology

## 2020-05-27 DIAGNOSIS — N2889 Other specified disorders of kidney and ureter: Secondary | ICD-10-CM

## 2020-07-24 ENCOUNTER — Other Ambulatory Visit: Payer: Self-pay

## 2020-08-12 ENCOUNTER — Other Ambulatory Visit: Payer: Self-pay

## 2022-02-04 NOTE — H&P (Signed)
Pre-Procedure H&P   Patient ID: Patrick Hinton is a 61 y.o. male.  Gastroenterology Provider: Annamaria Helling, DO  Referring Provider: Dr. Janene Harvey PCP: Dr. Janene Harvey  Date: 02/05/2022  HPI Patrick Hinton is a 61 y.o. male who presents today for Colonoscopy for Initial screening colonoscopy .  No family history of colon cancer or colon polyps.  The patient reports 2 bowel movements per day without melena or hematochezia.  He is status post anal fissure repair, prostatectomy due to prostate cancer, artificial urinary sphincter placement. Also has had an appendectomy  Per chart review he is a difficult intubation  Family history father pancreatic cancer maternal grandmother gallbladder cancer   Past Medical History:  Diagnosis Date   Cancer ([place])    Elevated PSA    Hypertension    Kidney stone     Past Surgical History:  Procedure Laterality Date   ANKLE SURGERY     bone fragments removal   NASAL SEPTUM SURGERY     PROSTATECTOMY     spincter surgery     TONSILECTOMY, ADENOIDECTOMY, BILATERAL MYRINGOTOMY AND TUBES      Family History father pancreatic cancer maternal grandmother gallbladder cancer No other h/o GI disease or malignancy  Review of Systems  Constitutional:  Negative for activity change, appetite change, chills, diaphoresis, fatigue, fever and unexpected weight change.  HENT:  Negative for trouble swallowing and voice change.   Respiratory:  Negative for shortness of breath and wheezing.   Cardiovascular:  Negative for chest pain, palpitations and leg swelling.  Gastrointestinal:  Negative for abdominal distention, abdominal pain, anal bleeding, blood in stool, constipation, diarrhea, nausea and vomiting.  Musculoskeletal:  Negative for arthralgias and myalgias.  Skin:  Negative for color change and pallor.  Neurological:  Negative for dizziness, syncope and weakness.  Psychiatric/Behavioral:  Negative for confusion. The patient is not  nervous/anxious.   All other systems reviewed and are negative.    Medications No current facility-administered medications on file prior to encounter.   Current Outpatient Medications on File Prior to Encounter  Medication Sig Dispense Refill   hydrochlorothiazide (HYDRODIURIL) 25 MG tablet Take 25 mg by mouth daily.     lisinopril (ZESTRIL) 10 MG tablet Take 10 mg by mouth daily.     acetaminophen (TYLENOL) 325 MG tablet Take 650 mg by mouth every 6 (six) hours as needed.     diazepam (VALIUM) 10 MG tablet 1 tab po 30 min prior to procedure 1 tablet 0   ibuprofen (ADVIL,MOTRIN) 200 MG tablet Take 200 mg by mouth every 6 (six) hours as needed.     loratadine (CLARITIN) 10 MG tablet Take by mouth.      Pertinent medications related to GI and procedure were reviewed by me with the patient prior to the procedure   Current Facility-Administered Medications:    0.9 %  sodium chloride infusion, , Intravenous, Continuous, Annamaria Helling, DO      Allergies  Allergen Reactions   Tylox [Oxycodone-Acetaminophen]     Cold chills and profuse sweating   Allergies were reviewed by me prior to the procedure  Objective   There is no height or weight on file to calculate BMI. Vitals:   02/05/22 0930  BP: 110/74  Pulse: 92  Resp: 20  Temp: (!) 97.3 F (36.3 C)  TempSrc: Temporal  SpO2: 99%     Physical Exam Vitals and nursing note reviewed.  Constitutional:      General: He is not  in acute distress.    Appearance: Normal appearance. He is obese. He is not ill-appearing, toxic-appearing or diaphoretic.  HENT:     Head: Normocephalic and atraumatic.     Nose: Nose normal.     Mouth/Throat:     Mouth: Mucous membranes are moist.     Pharynx: Oropharynx is clear.  Eyes:     General: No scleral icterus.    Extraocular Movements: Extraocular movements intact.  Cardiovascular:     Rate and Rhythm: Normal rate and regular rhythm.     Heart sounds: Normal heart sounds. No  murmur heard.    No friction rub. No gallop.  Pulmonary:     Effort: Pulmonary effort is normal. No respiratory distress.     Breath sounds: Normal breath sounds. No wheezing, rhonchi or rales.  Abdominal:     General: Bowel sounds are normal. There is no distension.     Palpations: Abdomen is soft.     Tenderness: There is no abdominal tenderness. There is no guarding or rebound.  Musculoskeletal:     Cervical back: Neck supple.     Right lower leg: No edema.     Left lower leg: No edema.  Skin:    General: Skin is warm and dry.     Coloration: Skin is not jaundiced or pale.  Neurological:     General: No focal deficit present.     Mental Status: He is alert and oriented to person, place, and time. Mental status is at baseline.  Psychiatric:        Mood and Affect: Mood normal.        Behavior: Behavior normal.        Thought Content: Thought content normal.        Judgment: Judgment normal.      Assessment:  Patrick Hinton is a 61 y.o. male  who presents today for Colonoscopy for initial screening colonoscopy.  Plan:  Colonoscopy with possible intervention today  Colonoscopy with possible biopsy, control of bleeding, polypectomy, and interventions as necessary has been discussed with the patient/patient representative. Informed consent was obtained from the patient/patient representative after explaining the indication, nature, and risks of the procedure including but not limited to death, bleeding, perforation, missed neoplasm/lesions, cardiorespiratory compromise, and reaction to medications. Opportunity for questions was given and appropriate answers were provided. Patient/patient representative has verbalized understanding is amenable to undergoing the procedure.   Annamaria Helling, DO  [facility]astroenter[facility]  Portions of the record may have been created with voice recognition software. Occasional wrong-word or 'sound-a-like' substitutions may have  occurred due to the inherent limitations of voice recognition software.  Read the chart carefully and recognize, using context, where substitutions may have occurred.

## 2022-02-05 ENCOUNTER — Encounter
Admission: RE | Disposition: A | Discharge status: Home / Self Care | Payer: Self-pay | Source: Ambulatory Visit | Attending: Gastroenterology

## 2022-02-05 ENCOUNTER — Encounter: Payer: Self-pay | Admitting: Gastroenterology

## 2022-02-05 ENCOUNTER — Ambulatory Visit: Admitting: Anesthesiology

## 2022-02-05 ENCOUNTER — Ambulatory Visit
Admission: RE | Admit: 2022-02-05 | Discharge: 2022-02-05 | Disposition: A | Discharge status: Home / Self Care | Source: Ambulatory Visit | Attending: Gastroenterology | Admitting: Gastroenterology

## 2022-02-05 DIAGNOSIS — Z960 Presence of urogenital implants: Secondary | ICD-10-CM | POA: Diagnosis not present

## 2022-02-05 DIAGNOSIS — K621 Rectal polyp: Secondary | ICD-10-CM | POA: Insufficient documentation

## 2022-02-05 DIAGNOSIS — Z9049 Acquired absence of other specified parts of digestive tract: Secondary | ICD-10-CM | POA: Diagnosis not present

## 2022-02-05 DIAGNOSIS — Z8546 Personal history of malignant neoplasm of prostate: Secondary | ICD-10-CM | POA: Diagnosis not present

## 2022-02-05 DIAGNOSIS — Z800 Family history of malignant neoplasm of digestive organs: Secondary | ICD-10-CM | POA: Diagnosis not present

## 2022-02-05 DIAGNOSIS — Z1211 Encounter for screening for malignant neoplasm of colon: Secondary | ICD-10-CM | POA: Insufficient documentation

## 2022-02-05 DIAGNOSIS — K640 First degree hemorrhoids: Secondary | ICD-10-CM | POA: Insufficient documentation

## 2022-02-05 DIAGNOSIS — I10 Essential (primary) hypertension: Secondary | ICD-10-CM | POA: Insufficient documentation

## 2022-02-05 DIAGNOSIS — Z9079 Acquired absence of other genital organ(s): Secondary | ICD-10-CM | POA: Insufficient documentation

## 2022-02-05 HISTORY — PX: COLONOSCOPY WITH PROPOFOL: SHX5780

## 2022-02-05 SURGERY — COLONOSCOPY WITH PROPOFOL
Anesthesia: General

## 2022-02-05 MED ORDER — SODIUM CHLORIDE 0.9 % IV SOLN: INTRAVENOUS | Status: DC

## 2022-02-05 MED ORDER — LIDOCAINE HCL (CARDIAC) PF 100 MG/5ML IV SOSY
PREFILLED_SYRINGE | INTRAVENOUS | Status: DC | PRN
  Administered 2022-02-05: 100 mg via INTRAVENOUS

## 2022-02-05 MED ORDER — PROPOFOL 10 MG/ML IV BOLUS
INTRAVENOUS | Status: DC | PRN
  Administered 2022-02-05: 70 mg via INTRAVENOUS
  Administered 2022-02-05: 30 mg via INTRAVENOUS

## 2022-02-05 MED ORDER — PROPOFOL 500 MG/50ML IV EMUL
INTRAVENOUS | Status: DC | PRN
  Administered 2022-02-05: 165 ug/kg/min via INTRAVENOUS

## 2022-02-05 NOTE — Op Note (Signed)
[facility] Gastroenterology Patient Name: Patrick Hinton Procedure Date: 02/05/2022 9:18 AM MRN: [id number] Account #: [id number] Date of Birth: May 07, 1960 Admit Type: Outpatient Age: 61 Room: [facility] ENDO ROOM 3 Gender: Male Note Status: Finalized Instrument Name: Colonscope [id number] Procedure:             Colonoscopy Indications:           Screening for colorectal malignant neoplasm Providers:             Annamaria Helling DO, DO Referring MD:          Ane Payment, MD (Referring MD) Medicines:             Monitored Anesthesia Care Complications:         No immediate complications. Estimated blood loss:                         Minimal. Procedure:             Pre-Anesthesia Assessment:                        - Prior to the procedure, a History and Physical was                         performed, and patient medications and allergies were                         reviewed. The patient is competent. The risks and                         benefits of the procedure and the sedation options and                         risks were discussed with the patient. All questions                         were answered and informed consent was obtained.                         Patient identification and proposed procedure were                         verified by the physician, the nurse, the anesthetist                         and the technician in the endoscopy suite. Mental                         Status Examination: alert and oriented. Airway                         Examination: normal oropharyngeal airway and neck                         mobility. Respiratory Examination: clear to                         auscultation. CV Examination: RRR, no murmurs, no S3  or S4. Prophylactic Antibiotics: The patient does not                         require prophylactic antibiotics. Prior                         Anticoagulants: The patient has taken no anticoagulant                          or antiplatelet agents. ASA Grade Assessment: II - A                         patient with mild systemic disease. After reviewing                         the risks and benefits, the patient was deemed in                         satisfactory condition to undergo the procedure. The                         anesthesia plan was to use monitored anesthesia care                         (MAC). Immediately prior to administration of                         medications, the patient was re-assessed for adequacy                         to receive sedatives. The heart rate, respiratory                         rate, oxygen saturations, blood pressure, adequacy of                         pulmonary ventilation, and response to care were                         monitored throughout the procedure. The physical                         status of the patient was re-assessed after the                         procedure.                        After obtaining informed consent, the colonoscope was                         passed under direct vision. Throughout the procedure,                         the patient's blood pressure, pulse, and oxygen                         saturations were monitored continuously. The  Colonoscope was introduced through the anus and                         advanced to the the terminal ileum, with                         identification of the appendiceal orifice and IC                         valve. The colonoscopy was performed without                         difficulty. The patient tolerated the procedure well.                         The quality of the bowel preparation was evaluated                         using the BBPS [facility] Bowel Preparation Scale) with                         scores of: Right Colon = 2 (minor amount of residual                         staining, small fragments of stool and/or opaque                         liquid, but  mucosa seen well), Transverse Colon = 3                         (entire mucosa seen well with no residual staining,                         small fragments of stool or opaque liquid) and Left                         Colon = 3 (entire mucosa seen well with no residual                         staining, small fragments of stool or opaque liquid).                         The total BBPS score equals 8. The quality of the                         bowel preparation was excellent. The ileocecal valve,                         appendiceal orifice, and rectum were photographed. Findings:      The perianal and digital rectal examinations were normal. Pertinent       negatives include normal sphincter tone.      Non-bleeding internal hemorrhoids were found during retroflexion. The       hemorrhoids were Grade I (internal hemorrhoids that do not prolapse).       Estimated blood loss: none.      Two sessile polyps were found in the rectum. The polyps were  1 to 2 mm       in size. These polyps were removed with a jumbo cold forceps. Resection       and retrieval were complete. Estimated blood loss was minimal.      The exam was otherwise without abnormality on direct and retroflexion       views. Impression:            - Non-bleeding internal hemorrhoids.                        - Two 1 to 2 mm polyps in the rectum, removed with a                         jumbo cold forceps. Resected and retrieved.                        - The examination was otherwise normal on direct and                         retroflexion views. Recommendation:        - Patient has a contact number available for                         emergencies. The signs and symptoms of potential                         delayed complications were discussed with the patient.                         Return to normal activities tomorrow. Written                         discharge instructions were provided to the patient.                        -  Discharge patient to home.                        - Resume previous diet.                        - Continue present medications.                        - Await pathology results.                        - Repeat colonoscopy for surveillance based on                         pathology results.                        - Return to referring physician as previously                         scheduled.                        - The findings and recommendations were discussed with  the patient. Procedure Code(s):     --- Professional ---                        [id number], Colonoscopy, flexible; with biopsy, single or                         multiple Diagnosis Code(s):     --- Professional ---                        Z12.11, Encounter for screening for malignant neoplasm                         of colon                        D12.8, Benign neoplasm of rectum                        K64.0, First degree hemorrhoids CPT copyright 2022 American Medical Association. All rights reserved. The codes documented in this report are preliminary and upon coder review may  be revised to meet current compliance requirements. Attending Participation:      I personally performed the entire procedure. Volney American, DO Annamaria Helling DO, DO 02/05/2022 10:32:33 AM This report has been signed electronically. Number of Addenda: 0 Note Initiated On: 02/05/2022 9:18 AM Scope Withdrawal Time: 0 hours 29 minutes 0 seconds  Total Procedure Duration: 0 hours 33 minutes 17 seconds  Estimated Blood Loss:  Estimated blood loss was minimal.      [facility]

## 2022-02-05 NOTE — Anesthesia Preprocedure Evaluation (Addendum)
Anesthesia Evaluation  Patient identified by MRN, date of birth, ID band Patient awake    Reviewed: Allergy & Precautions, NPO status , Patient's Chart, lab work & pertinent test results  Airway Mallampati: III  TM Distance: >3 FB Neck ROM: full    Dental no notable dental hx.    Pulmonary Current Smoker   Pulmonary exam normal        Cardiovascular hypertension, Normal cardiovascular exam     Neuro/Psych negative neurological ROS  negative psych ROS   GI/Hepatic negative GI ROS, Neg liver ROS,,,  Endo/Other    Renal/GU negative Renal ROS  negative genitourinary   Musculoskeletal   Abdominal  (+) + obese  Peds  Hematology negative hematology ROS (+)   Anesthesia Other Findings Past Medical History: No date: Cancer ([place]) No date: Elevated PSA No date: Hypertension No date: Kidney stone Prostate cancer  Past Surgical History: No date: ANKLE SURGERY     Comment:  bone fragments removal No date: NASAL SEPTUM SURGERY No date: spincter surgery No date: TONSILECTOMY, ADENOIDECTOMY, BILATERAL MYRINGOTOMY AND TUBES     Reproductive/Obstetrics negative OB ROS                             Anesthesia Physical Anesthesia Plan  ASA: 2  Anesthesia Plan: General   Post-op Pain Management:    Induction:   PONV Risk Score and Plan: Propofol infusion and TIVA  Airway Management Planned: Natural Airway  Additional Equipment:   Intra-op Plan:   Post-operative Plan:   Informed Consent: I have reviewed the patients History and Physical, chart, labs and discussed the procedure including the risks, benefits and alternatives for the proposed anesthesia with the patient or authorized representative who has indicated his/her understanding and acceptance.     Dental Advisory Given  Plan Discussed with: Anesthesiologist, CRNA and Surgeon  Anesthesia Plan Comments:          Anesthesia Quick Evaluation

## 2022-02-05 NOTE — Transfer of Care (Signed)
Immediate Anesthesia Transfer of Care Note  Patient: QUAYSHAWN NIN  Procedure(s) Performed: COLONOSCOPY WITH PROPOFOL  Patient Location: Endoscopy Unit  Anesthesia Type:General  Level of Consciousness: drowsy and patient cooperative  Airway & Oxygen Therapy: Patient connected to face mask oxygen  Post-op Assessment: Report given to RN and Post -op Vital signs reviewed and stable  Post vital signs: Reviewed and stable  Last Vitals:  Vitals Value Taken Time  BP 93/59 02/05/22 1028  Temp    Pulse 87 02/05/22 1028  Resp 12 02/05/22 1028  SpO2 100 % 02/05/22 1028  Vitals shown include unvalidated device data.  Last Pain:  Vitals:   02/05/22 0930  TempSrc: Temporal  PainSc: 0-No pain         Complications: No notable events documented.

## 2022-02-05 NOTE — Interval H&P Note (Signed)
History and Physical Interval Note: Preprocedure H&P from 02/05/22  was reviewed and there was no interval change after seeing and examining the patient.  Written consent was obtained from the patient after discussion of risks, benefits, and alternatives. Patient has consented to proceed with Colonoscopy with possible intervention   02/05/2022 9:34 AM  Patrick Hinton  has presented today for surgery, with the diagnosis of colon cancer screening.  The various methods of treatment have been discussed with the patient and family. After consideration of risks, benefits and other options for treatment, the patient has consented to  Procedure(s): COLONOSCOPY WITH PROPOFOL (N/A) as a surgical intervention.  The patient's history has been reviewed, patient examined, no change in status, stable for surgery.  I have reviewed the patient's chart and labs.  Questions were answered to the patient's satisfaction.     Annamaria Helling

## 2022-02-09 LAB — SURGICAL PATHOLOGY

## 2022-02-22 NOTE — Anesthesia Postprocedure Evaluation (Signed)
Anesthesia Post Note  Patient: [name]  Procedure(s) Performed: COLONOSCOPY WITH PROPOFOL  Patient location during evaluation: PACU Anesthesia Type: General Level of consciousness: awake and alert Pain management: pain level controlled Vital Signs Assessment: post-procedure vital signs reviewed and stable Respiratory status: spontaneous breathing, nonlabored ventilation and respiratory function stable Cardiovascular status: blood pressure returned to baseline and stable Postop Assessment: no apparent nausea or vomiting Anesthetic complications: no   No notable events documented.   Last Vitals:  Vitals:   02/05/22 1032 02/05/22 1042  BP: 120/83 115/74  Pulse:    Resp:    Temp:    SpO2:      Last Pain:  Vitals:   02/07/22 1430  TempSrc:   PainSc: 0-No pain                 [place] Ouch

## 2022-05-31 ENCOUNTER — Other Ambulatory Visit: Payer: Self-pay | Admitting: Otolaryngology

## 2022-05-31 DIAGNOSIS — E079 Disorder of thyroid, unspecified: Secondary | ICD-10-CM

## 2022-06-11 ENCOUNTER — Ambulatory Visit
Admission: RE | Admit: 2022-06-11 | Discharge: 2022-06-11 | Disposition: A | Discharge status: Home / Self Care | Source: Ambulatory Visit | Attending: Otolaryngology | Admitting: Otolaryngology

## 2022-06-11 DIAGNOSIS — E079 Disorder of thyroid, unspecified: Secondary | ICD-10-CM | POA: Insufficient documentation

## 2022-06-16 ENCOUNTER — Encounter: Payer: Self-pay | Admitting: Otolaryngology

## 2022-07-02 ENCOUNTER — Inpatient Hospital Stay
Admission: RE | Admit: 2022-07-02 | Discharge: 2022-07-02 | Disposition: A | Discharge status: Home / Self Care | Payer: Self-pay | Source: Ambulatory Visit | Attending: Otolaryngology | Admitting: Otolaryngology

## 2022-07-02 ENCOUNTER — Other Ambulatory Visit: Payer: Self-pay | Admitting: Otolaryngology

## 2022-07-02 DIAGNOSIS — R899 Unspecified abnormal finding in specimens from other organs, systems and tissues: Secondary | ICD-10-CM

## 2022-07-07 ENCOUNTER — Other Ambulatory Visit: Payer: Self-pay | Admitting: Otolaryngology

## 2022-07-07 DIAGNOSIS — E041 Nontoxic single thyroid nodule: Secondary | ICD-10-CM

## 2022-07-15 ENCOUNTER — Ambulatory Visit
Admission: RE | Admit: 2022-07-15 | Discharge: 2022-07-15 | Disposition: A | Discharge status: Home / Self Care | Source: Ambulatory Visit | Attending: Otolaryngology | Admitting: Otolaryngology

## 2022-07-15 DIAGNOSIS — E041 Nontoxic single thyroid nodule: Secondary | ICD-10-CM | POA: Diagnosis present

## 2022-07-15 MED ORDER — LIDOCAINE HCL (PF) 1 % IJ SOLN
10.0000 mL | Freq: Once | INTRAMUSCULAR | Status: AC
  Administered 2022-07-15: 10 mL via SUBCUTANEOUS
  Filled 2022-07-15: qty 10

## 2022-07-15 NOTE — Procedures (Signed)
Successful US guided FNA of right inferior thyroid nodule No complications. See PACS for full report.  Berneta Levins, PA-C 07/15/2022, 2:08 PM

## 2022-08-02 ENCOUNTER — Encounter: Payer: Self-pay | Admitting: Otolaryngology

## 2022-08-02 LAB — CYTOLOGY - NON PAP

## 2022-08-09 ENCOUNTER — Inpatient Hospital Stay: Admission: RE | Admit: 2022-08-09 | Source: Ambulatory Visit

## 2022-08-12 SURGERY — Surgical Case
Anesthesia: *Unknown

## 2022-08-17 ENCOUNTER — Ambulatory Visit: Admit: 2022-08-17 | Admitting: Otolaryngology

## 2022-08-17 SURGERY — THYROIDECTOMY
Anesthesia: Choice

## 2023-03-01 ENCOUNTER — Encounter: Payer: Self-pay | Admitting: Otolaryngology

## 2023-05-01 ENCOUNTER — Ambulatory Visit
Admission: EM | Admit: 2023-05-01 | Discharge: 2023-05-01 | Disposition: A | Discharge status: Home / Self Care | Attending: Family Medicine | Admitting: Family Medicine

## 2023-05-01 DIAGNOSIS — L259 Unspecified contact dermatitis, unspecified cause: Secondary | ICD-10-CM

## 2023-05-01 MED ORDER — PREDNISONE 10 MG (21) PO TBPK: ORAL_TABLET | Freq: Every day | ORAL | 0 refills | Status: AC

## 2023-05-01 MED ORDER — TRIAMCINOLONE ACETONIDE 0.1 % EX CREA: 1.0000 | TOPICAL_CREAM | Freq: Two times a day (BID) | CUTANEOUS | 0 refills | Status: AC

## 2023-05-01 NOTE — ED Triage Notes (Signed)
 Pt c/o possible poison oak along arms, torso, and waist x2-3weeks  Pt states that the rash is progressively getting worse.   Pt has used OTC calamine lotion and benadryl but it does not help.

## 2023-05-01 NOTE — ED Provider Notes (Signed)
 [facility]    CSN: [id number] Arrival date & time: 05/01/23  1200      History   Chief Complaint Chief Complaint  Patient presents with   Rash    HPI Patrick Hinton is a 63 y.o. male presents for rash.  Patient reports 2 to 3 weeks ago he was in his yard cleaning out an overgrown area and believes he was in contact with either poison oak or poison ivy.  States he had a blistering rash since that time primarily on his left mid arm, abdomen and right arm.  He has been doing calamine lotion and Benadryl with minimal improvement.  No fevers chills swelling drainage.  Denies history of eczema or psoriasis.  States has had poison oak in the past and these symptoms seem consistent with that.  No other concerns at this time.   Rash   Past Medical History:  Diagnosis Date   Cancer (HCC)    Elevated PSA    Hypertension    Kidney stone     Patient Active Problem List   Diagnosis Date Noted   Prostate cancer (HCC) 10/30/2018   Essential hypertension 10/17/2018   Morbid obesity (HCC) 09/19/2018   Hx of smoking 07/04/2017   Blood pressure elevated without history of HTN 02/19/2015   Gross hematuria 09/13/2014   Elevated PSA 09/13/2014   Smokes tobacco daily 08/12/2014   Hypercholesteremia 07/19/2014    Past Surgical History:  Procedure Laterality Date   ANKLE SURGERY     bone fragments removal   COLONOSCOPY WITH PROPOFOL N/A 02/05/2022   Procedure: COLONOSCOPY WITH PROPOFOL;  Surgeon: Jaynie Collins, DO;  Location: [facility] ENDOSCOPY;  Service: Gastroenterology;  Laterality: N/A;   NASAL SEPTUM SURGERY     PROSTATECTOMY     spincter surgery     TONSILECTOMY, ADENOIDECTOMY, BILATERAL MYRINGOTOMY AND TUBES         Home Medications    Prior to Admission medications   Medication Sig Start Date End Date Taking? Authorizing Provider  acetaminophen (TYLENOL) 325 MG tablet Take 650 mg by mouth every 6 (six) hours as needed.   Yes [provider]   Cetirizine HCl 10 MG TBDP Take by mouth.   Yes [provider]  hydrochlorothiazide (HYDRODIURIL) 25 MG tablet Take 25 mg by mouth daily. 09/20/18  Yes [provider]  ibuprofen (ADVIL,MOTRIN) 200 MG tablet Take 200 mg by mouth every 6 (six) hours as needed.   Yes [provider]  lisinopril (ZESTRIL) 10 MG tablet Take 10 mg by mouth daily. 10/17/18  Yes [provider]  predniSONE (STERAPRED UNI-PAK 21 TAB) 10 MG (21) TBPK tablet Take by mouth daily. Take 6 tabs by mouth daily  for 1 day, then 5 tabs for 1 day, then 4 tabs for 1 day, then 3 tabs for 1 day, 2 tabs for 1 day, then 1 tab by mouth daily for 1 days 05/01/23  Yes Radford Pax, NP  triamcinolone cream (KENALOG) 0.1 % Apply 1 Application topically 2 (two) times daily. 05/01/23  Yes Radford Pax, NP  diazepam (VALIUM) 10 MG tablet 1 tab po 30 min prior to procedure 12/15/18   Stoioff, Verna Czech, MD  loratadine (CLARITIN) 10 MG tablet Take by mouth.    [provider]    Family History Family History  Problem Relation Age of Onset   Prostate cancer Father    Cancer Father        pancreatic   Urolithiasis  Father    Cancer Paternal Grandmother    Cancer Maternal Grandmother    Urolithiasis Brother    Kidney cancer Neg Hx     Social History Social History   Tobacco Use   Smoking status: Former    Current packs/day: 1.00    Types: Cigarettes   Smokeless tobacco: Never  Vaping Use   Vaping status: Never Used  Substance Use Topics   Alcohol use: Yes    Alcohol/week: 0.0 standard drinks of alcohol   Drug use: No     Allergies   Tylox [oxycodone-acetaminophen]   Review of Systems Review of Systems  Skin:  Positive for rash.     Physical Exam Triage Vital Signs ED Triage Vitals  Encounter Vitals Group     BP 05/01/23 1217 114/77     Systolic BP Percentile --      Diastolic BP Percentile --      Pulse Rate 05/01/23 1217 95     Resp --      Temp 05/01/23 1217 98.5 F  (36.9 C)     Temp Source 05/01/23 1217 Oral     SpO2 05/01/23 1217 96 %     Weight 05/01/23 1218 265 lb (120.2 kg)     Height 05/01/23 1218 5\' 10"  (1.778 m)     Head Circumference --      Peak Flow --      Pain Score 05/01/23 1217 7     Pain Loc --      Pain Education --      Exclude from Growth Chart --    No data found.  Updated Vital Signs BP 114/77 (BP Location: Left Arm)   Pulse 95   Temp 98.5 F (36.9 C) (Oral)   Ht 5\' 10"  (1.778 m)   Wt 265 lb (120.2 kg)   SpO2 96%   BMI 38.02 kg/m   Visual Acuity Right Eye Distance:   Left Eye Distance:   Bilateral Distance:    Right Eye Near:   Left Eye Near:    Bilateral Near:     Physical Exam Vitals and nursing note reviewed.  Constitutional:      General: He is not in acute distress.    Appearance: He is obese. He is not ill-appearing.  HENT:     Head: Normocephalic and atraumatic.  Eyes:     Pupils: Pupils are equal, round, and reactive to light.  Cardiovascular:     Rate and Rhythm: Normal rate.  Pulmonary:     Effort: Pulmonary effort is normal.  Skin:    General: Skin is warm and dry.          Comments: There are scattered areas of dry mildly erythematous macular papular rash on the left and right medial arms and left abdomen.  There is no pustules or vesicles.  Neurological:     General: No focal deficit present.     Mental Status: He is alert and oriented to person, place, and time.  Psychiatric:        Mood and Affect: Mood normal.        Behavior: Behavior normal.      UC Treatments / Results  Labs (all labs ordered are listed, but only abnormal results are displayed) Labs Reviewed - No data to display  EKG   Radiology No results found.  Procedures Procedures (including critical care time)  Medications Ordered in UC Medications - No data to display  Initial Impression / Assessment and  Plan / UC Course  I have reviewed the triage vital signs and the nursing notes.  Pertinent labs &  imaging results that were available during my care of the patient were reviewed by me and considered in my medical decision making (see chart for details).     Reviewed exam and symptoms with patient.  No red flags.  Discussed contact dermatitis we will do prednisone taper as well as topical triamcinolone.  May continue Benadryl and calamine lotion as needed.  PCP follow-up if symptoms do not improve.  ER precautions reviewed and patient verbalized understanding. Final Clinical Impressions(s) / UC Diagnoses   Final diagnoses:  Contact dermatitis, unspecified contact dermatitis type, unspecified trigger     Discharge Instructions      Start prednisone taper as prescribed.  Topical triamcinolone steroid cream to affected areas twice daily as needed for itching.  You may continue over-the-counter Benadryl and your allergy medicine as needed.  Please follow-up with your PCP if your symptoms do not improve.  Please go to the ER for any worsening symptoms.  Hope you feel better soon!    ED Prescriptions     Medication Sig Dispense Auth. Provider   predniSONE (STERAPRED UNI-PAK 21 TAB) 10 MG (21) TBPK tablet Take by mouth daily. Take 6 tabs by mouth daily  for 1 day, then 5 tabs for 1 day, then 4 tabs for 1 day, then 3 tabs for 1 day, 2 tabs for 1 day, then 1 tab by mouth daily for 1 days 21 tablet Radford Pax, NP   triamcinolone cream (KENALOG) 0.1 % Apply 1 Application topically 2 (two) times daily. 30 g Radford Pax, NP      PDMP not reviewed this encounter.   Radford Pax, NP 05/01/23 1236

## 2023-05-01 NOTE — Discharge Instructions (Addendum)
 Start prednisone taper as prescribed.  Topical triamcinolone steroid cream to affected areas twice daily as needed for itching.  You may continue over-the-counter Benadryl and your allergy medicine as needed.  Please follow-up with your PCP if your symptoms do not improve.  Please go to the ER for any worsening symptoms.  Hope you feel better soon!

## 2023-07-13 ENCOUNTER — Other Ambulatory Visit: Payer: Self-pay | Admitting: Otolaryngology

## 2023-07-13 DIAGNOSIS — E079 Disorder of thyroid, unspecified: Secondary | ICD-10-CM

## 2023-07-19 ENCOUNTER — Ambulatory Visit
Admission: RE | Admit: 2023-07-19 | Discharge: 2023-07-19 | Disposition: A | Discharge status: Home / Self Care | Source: Ambulatory Visit | Attending: Otolaryngology | Admitting: Otolaryngology

## 2023-07-19 DIAGNOSIS — E079 Disorder of thyroid, unspecified: Secondary | ICD-10-CM | POA: Insufficient documentation
